# Patient Record
Sex: Female | Born: 1955 | Race: White | Hispanic: No | Marital: Single | Smoking: Current every day smoker
Health system: Southern US, Community
[De-identification: ages and names within clinical notes are randomized; demographics above are authoritative.]

## PROBLEM LIST (undated history)

## (undated) DIAGNOSIS — C801 Malignant (primary) neoplasm, unspecified: Secondary | ICD-10-CM

## (undated) DIAGNOSIS — J449 Chronic obstructive pulmonary disease, unspecified: Secondary | ICD-10-CM

## (undated) DIAGNOSIS — I10 Essential (primary) hypertension: Secondary | ICD-10-CM

## (undated) DIAGNOSIS — J45909 Unspecified asthma, uncomplicated: Secondary | ICD-10-CM

## (undated) DIAGNOSIS — E119 Type 2 diabetes mellitus without complications: Secondary | ICD-10-CM

## (undated) DIAGNOSIS — K589 Irritable bowel syndrome without diarrhea: Secondary | ICD-10-CM

## (undated) HISTORY — PX: ABDOMINAL HYSTERECTOMY: SHX81

---

## 1996-07-19 HISTORY — PX: CHOLECYSTECTOMY: SHX55

## 2018-01-19 ENCOUNTER — Encounter (HOSPITAL_BASED_OUTPATIENT_CLINIC_OR_DEPARTMENT_OTHER): Payer: Self-pay | Admitting: *Deleted

## 2018-01-19 ENCOUNTER — Other Ambulatory Visit: Payer: Self-pay

## 2018-01-19 ENCOUNTER — Emergency Department (HOSPITAL_COMMUNITY): Payer: BLUE CROSS/BLUE SHIELD | Admitting: Anesthesiology

## 2018-01-19 ENCOUNTER — Encounter (HOSPITAL_COMMUNITY)
Admission: EM | Disposition: A | Discharge status: Home / Self Care | Payer: Self-pay | Source: Home / Self Care | Attending: Emergency Medicine

## 2018-01-19 ENCOUNTER — Emergency Department (HOSPITAL_BASED_OUTPATIENT_CLINIC_OR_DEPARTMENT_OTHER): Payer: BLUE CROSS/BLUE SHIELD

## 2018-01-19 ENCOUNTER — Observation Stay (HOSPITAL_BASED_OUTPATIENT_CLINIC_OR_DEPARTMENT_OTHER)
Admission: EM | Admit: 2018-01-19 | Discharge: 2018-01-20 | Disposition: A | Discharge status: Home / Self Care | Payer: BLUE CROSS/BLUE SHIELD | Attending: Surgery | Admitting: Surgery

## 2018-01-19 DIAGNOSIS — J45909 Unspecified asthma, uncomplicated: Secondary | ICD-10-CM | POA: Diagnosis not present

## 2018-01-19 DIAGNOSIS — F17210 Nicotine dependence, cigarettes, uncomplicated: Secondary | ICD-10-CM | POA: Diagnosis not present

## 2018-01-19 DIAGNOSIS — Z79899 Other long term (current) drug therapy: Secondary | ICD-10-CM | POA: Insufficient documentation

## 2018-01-19 DIAGNOSIS — J449 Chronic obstructive pulmonary disease, unspecified: Secondary | ICD-10-CM | POA: Diagnosis not present

## 2018-01-19 DIAGNOSIS — Z720 Tobacco use: Secondary | ICD-10-CM

## 2018-01-19 DIAGNOSIS — K3580 Unspecified acute appendicitis: Secondary | ICD-10-CM

## 2018-01-19 DIAGNOSIS — Z7984 Long term (current) use of oral hypoglycemic drugs: Secondary | ICD-10-CM | POA: Insufficient documentation

## 2018-01-19 DIAGNOSIS — K3533 Acute appendicitis with perforation and localized peritonitis, with abscess: Secondary | ICD-10-CM | POA: Insufficient documentation

## 2018-01-19 DIAGNOSIS — I10 Essential (primary) hypertension: Secondary | ICD-10-CM | POA: Diagnosis present

## 2018-01-19 DIAGNOSIS — E669 Obesity, unspecified: Secondary | ICD-10-CM

## 2018-01-19 DIAGNOSIS — Z9049 Acquired absence of other specified parts of digestive tract: Secondary | ICD-10-CM | POA: Insufficient documentation

## 2018-01-19 DIAGNOSIS — K3532 Acute appendicitis with perforation and localized peritonitis, without abscess: Principal | ICD-10-CM | POA: Insufficient documentation

## 2018-01-19 DIAGNOSIS — R1031 Right lower quadrant pain: Secondary | ICD-10-CM | POA: Diagnosis present

## 2018-01-19 DIAGNOSIS — K35891 Other acute appendicitis without perforation, with gangrene: Secondary | ICD-10-CM | POA: Insufficient documentation

## 2018-01-19 DIAGNOSIS — E119 Type 2 diabetes mellitus without complications: Secondary | ICD-10-CM

## 2018-01-19 DIAGNOSIS — R112 Nausea with vomiting, unspecified: Secondary | ICD-10-CM

## 2018-01-19 DIAGNOSIS — K589 Irritable bowel syndrome without diarrhea: Secondary | ICD-10-CM

## 2018-01-19 DIAGNOSIS — T40605A Adverse effect of unspecified narcotics, initial encounter: Secondary | ICD-10-CM

## 2018-01-19 HISTORY — DX: Malignant (primary) neoplasm, unspecified: C80.1

## 2018-01-19 HISTORY — DX: Type 2 diabetes mellitus without complications: E11.9

## 2018-01-19 HISTORY — DX: Unspecified asthma, uncomplicated: J45.909

## 2018-01-19 HISTORY — DX: Irritable bowel syndrome, unspecified: K58.9

## 2018-01-19 HISTORY — DX: Chronic obstructive pulmonary disease, unspecified: J44.9

## 2018-01-19 HISTORY — PX: LAPAROSCOPIC APPENDECTOMY: SHX408

## 2018-01-19 HISTORY — DX: Essential (primary) hypertension: I10

## 2018-01-19 LAB — URINALYSIS, ROUTINE W REFLEX MICROSCOPIC
Bilirubin Urine: NEGATIVE
GLUCOSE, UA: NEGATIVE mg/dL
KETONES UR: NEGATIVE mg/dL
LEUKOCYTES UA: NEGATIVE
Nitrite: POSITIVE — AB
PH: 6 (ref 5.0–8.0)
Protein, ur: NEGATIVE mg/dL

## 2018-01-19 LAB — COMPREHENSIVE METABOLIC PANEL
ALBUMIN: 4.3 g/dL (ref 3.5–5.0)
ALK PHOS: 85 U/L (ref 38–126)
ALT: 23 U/L (ref 0–44)
AST: 21 U/L (ref 15–41)
Anion gap: 10 (ref 5–15)
BILIRUBIN TOTAL: 0.5 mg/dL (ref 0.3–1.2)
BUN: 13 mg/dL (ref 8–23)
CO2: 26 mmol/L (ref 22–32)
Calcium: 9.2 mg/dL (ref 8.9–10.3)
Chloride: 101 mmol/L (ref 98–111)
Creatinine, Ser: 0.8 mg/dL (ref 0.44–1.00)
GFR calc Af Amer: >60 mL/min (ref >60)
GFR calc non Af Amer: >60 mL/min (ref >60)
GLUCOSE: 121 mg/dL — AB (ref 70–99)
Potassium: 4.3 mmol/L (ref 3.5–5.1)
Sodium: 137 mmol/L (ref 135–145)
TOTAL PROTEIN: 7.9 g/dL (ref 6.5–8.1)

## 2018-01-19 LAB — CBC WITH DIFFERENTIAL/PLATELET
BASOS ABS: 0 10*3/uL (ref 0.0–0.1)
BASOS PCT: 0 %
Eosinophils Absolute: 0.2 10*3/uL (ref 0.0–0.7)
Eosinophils Relative: 1 %
HEMATOCRIT: 54.1 % — AB (ref 36.0–46.0)
HEMOGLOBIN: 18.4 g/dL — AB (ref 12.0–15.0)
Lymphocytes Relative: 11 %
Lymphs Abs: 1.6 10*3/uL (ref 0.7–4.0)
MCH: 29.3 pg (ref 26.0–34.0)
MCHC: 34 g/dL (ref 30.0–36.0)
MCV: 86.1 fL (ref 78.0–100.0)
Monocytes Absolute: 0.9 10*3/uL (ref 0.1–1.0)
Monocytes Relative: 6 %
NEUTROS ABS: 12 10*3/uL — AB (ref 1.7–7.7)
NEUTROS PCT: 82 %
Platelets: 190 10*3/uL (ref 150–400)
RBC: 6.28 MIL/uL — ABNORMAL HIGH (ref 3.87–5.11)
RDW: 15.5 % (ref 11.5–15.5)
WBC: 14.8 10*3/uL — ABNORMAL HIGH (ref 4.0–10.5)

## 2018-01-19 LAB — GLUCOSE, CAPILLARY
Glucose-Capillary: 139 mg/dL — ABNORMAL HIGH (ref 70–99)
Glucose-Capillary: 161 mg/dL — ABNORMAL HIGH (ref 70–99)
Glucose-Capillary: 124 mg/dL — ABNORMAL HIGH (ref 70–99)

## 2018-01-19 LAB — LIPASE, BLOOD: Lipase: 26 U/L (ref 11–51)

## 2018-01-19 LAB — URINALYSIS, MICROSCOPIC (REFLEX)

## 2018-01-19 SURGERY — APPENDECTOMY, LAPAROSCOPIC
Anesthesia: General

## 2018-01-19 MED ORDER — SUGAMMADEX SODIUM 200 MG/2ML IV SOLN
INTRAVENOUS | Status: AC
  Filled 2018-01-19: qty 2

## 2018-01-19 MED ORDER — GABAPENTIN 300 MG PO CAPS
ORAL_CAPSULE | ORAL | Status: AC
  Administered 2018-01-19: 300 mg via ORAL
  Filled 2018-01-19: qty 1

## 2018-01-19 MED ORDER — METHOCARBAMOL 500 MG PO TABS: 1000.0000 mg | ORAL_TABLET | Freq: Four times a day (QID) | ORAL | Status: DC | PRN

## 2018-01-19 MED ORDER — MIDAZOLAM HCL 2 MG/2ML IJ SOLN
INTRAMUSCULAR | Status: DC | PRN
  Administered 2018-01-19: 2 mg via INTRAVENOUS

## 2018-01-19 MED ORDER — SODIUM CHLORIDE 0.9 % IV SOLN
INTRAVENOUS | Status: AC
  Filled 2018-01-19: qty 2

## 2018-01-19 MED ORDER — HYDROMORPHONE HCL 1 MG/ML IJ SOLN: 0.2500 mg | INTRAMUSCULAR | Status: DC | PRN

## 2018-01-19 MED ORDER — DEXAMETHASONE SODIUM PHOSPHATE 10 MG/ML IJ SOLN
INTRAMUSCULAR | Status: AC
Start: 2018-01-19 — End: ?
  Filled 2018-01-19: qty 1

## 2018-01-19 MED ORDER — INSULIN ASPART 100 UNIT/ML ~~LOC~~ SOLN: 0.0000 [IU] | Freq: Every day | SUBCUTANEOUS | Status: DC

## 2018-01-19 MED ORDER — TIOTROPIUM BROMIDE MONOHYDRATE 18 MCG IN CAPS
18.0000 ug | ORAL_CAPSULE | Freq: Every day | RESPIRATORY_TRACT | Status: DC
  Administered 2018-01-20: 18 ug via RESPIRATORY_TRACT
  Filled 2018-01-19: qty 5

## 2018-01-19 MED ORDER — GABAPENTIN 300 MG PO CAPS
300.0000 mg | ORAL_CAPSULE | ORAL | Status: AC
  Administered 2018-01-19: 300 mg via ORAL

## 2018-01-19 MED ORDER — ALUM & MAG HYDROXIDE-SIMETH 200-200-20 MG/5ML PO SUSP: 30.0000 mL | Freq: Four times a day (QID) | ORAL | Status: DC | PRN

## 2018-01-19 MED ORDER — PROCHLORPERAZINE EDISYLATE 10 MG/2ML IJ SOLN: 5.0000 mg | INTRAMUSCULAR | Status: DC | PRN

## 2018-01-19 MED ORDER — ENOXAPARIN SODIUM 40 MG/0.4ML ~~LOC~~ SOLN
40.0000 mg | SUBCUTANEOUS | Status: DC
  Filled 2018-01-19: qty 0.4

## 2018-01-19 MED ORDER — ALBUTEROL SULFATE (2.5 MG/3ML) 0.083% IN NEBU
2.5000 mg | INHALATION_SOLUTION | Freq: Once | RESPIRATORY_TRACT | Status: AC
  Administered 2018-01-19: 2.5 mg via RESPIRATORY_TRACT

## 2018-01-19 MED ORDER — SODIUM CHLORIDE 0.9 % IV SOLN
2.0000 g | INTRAVENOUS | Status: AC
  Administered 2018-01-19: 2 g via INTRAVENOUS
  Filled 2018-01-19: qty 2

## 2018-01-19 MED ORDER — GLIPIZIDE 5 MG PO TABS
5.0000 mg | ORAL_TABLET | Freq: Every day | ORAL | Status: DC
  Administered 2018-01-20: 5 mg via ORAL
  Filled 2018-01-19: qty 1

## 2018-01-19 MED ORDER — PIPERACILLIN-TAZOBACTAM 3.375 G IVPB
3.3750 g | Freq: Three times a day (TID) | INTRAVENOUS | Status: DC
  Administered 2018-01-19 – 2018-01-20 (×3): 3.375 g via INTRAVENOUS
  Filled 2018-01-19 (×4): qty 50

## 2018-01-19 MED ORDER — PHENYLEPHRINE 40 MCG/ML (10ML) SYRINGE FOR IV PUSH (FOR BLOOD PRESSURE SUPPORT)
PREFILLED_SYRINGE | INTRAVENOUS | Status: AC
  Filled 2018-01-19: qty 10

## 2018-01-19 MED ORDER — FLUCONAZOLE 200 MG PO TABS
200.0000 mg | ORAL_TABLET | Freq: Every day | ORAL | Status: DC
  Administered 2018-01-19 – 2018-01-20 (×2): 200 mg via ORAL
  Filled 2018-01-19: qty 1
  Filled 2018-01-19: qty 2
  Filled 2018-01-19: qty 1

## 2018-01-19 MED ORDER — ALBUTEROL SULFATE (2.5 MG/3ML) 0.083% IN NEBU
INHALATION_SOLUTION | RESPIRATORY_TRACT | Status: AC
  Administered 2018-01-19: 2.5 mg via RESPIRATORY_TRACT
  Filled 2018-01-19: qty 3

## 2018-01-19 MED ORDER — STERILE WATER FOR IRRIGATION IR SOLN
Status: DC | PRN
  Administered 2018-01-19: 2000 mL

## 2018-01-19 MED ORDER — SIMETHICONE 80 MG PO CHEW
40.0000 mg | CHEWABLE_TABLET | Freq: Four times a day (QID) | ORAL | Status: DC | PRN
  Filled 2018-01-19: qty 1

## 2018-01-19 MED ORDER — TRAMADOL HCL 50 MG PO TABS: 50.0000 mg | ORAL_TABLET | Freq: Four times a day (QID) | ORAL | Status: DC | PRN

## 2018-01-19 MED ORDER — HYDROCORTISONE 1 % EX CREA
1.0000 "application " | TOPICAL_CREAM | Freq: Three times a day (TID) | CUTANEOUS | Status: DC | PRN
  Filled 2018-01-19: qty 28

## 2018-01-19 MED ORDER — BISACODYL 10 MG RE SUPP: 10.0000 mg | Freq: Two times a day (BID) | RECTAL | Status: DC | PRN

## 2018-01-19 MED ORDER — MEPERIDINE HCL 50 MG/ML IJ SOLN: 6.2500 mg | INTRAMUSCULAR | Status: DC | PRN

## 2018-01-19 MED ORDER — POLYETHYLENE GLYCOL 3350 17 G PO PACK: 17.0000 g | PACK | Freq: Two times a day (BID) | ORAL | Status: DC | PRN

## 2018-01-19 MED ORDER — SCOPOLAMINE 1 MG/3DAYS TD PT72
MEDICATED_PATCH | TRANSDERMAL | Status: AC
  Administered 2018-01-19: 1.5 mg via TRANSDERMAL
  Filled 2018-01-19: qty 1

## 2018-01-19 MED ORDER — ONDANSETRON HCL 4 MG/2ML IJ SOLN: 4.0000 mg | Freq: Four times a day (QID) | INTRAMUSCULAR | Status: DC | PRN

## 2018-01-19 MED ORDER — METRONIDAZOLE IN NACL 5-0.79 MG/ML-% IV SOLN
500.0000 mg | Freq: Once | INTRAVENOUS | Status: AC
  Administered 2018-01-19: 500 mg via INTRAVENOUS
  Filled 2018-01-19: qty 100

## 2018-01-19 MED ORDER — ACETAMINOPHEN 500 MG PO TABS
ORAL_TABLET | ORAL | Status: AC
  Administered 2018-01-19: 1000 mg via ORAL
  Filled 2018-01-19: qty 2

## 2018-01-19 MED ORDER — LIP MEDEX EX OINT
1.0000 "application " | TOPICAL_OINTMENT | Freq: Two times a day (BID) | CUTANEOUS | Status: DC
  Administered 2018-01-19 – 2018-01-20 (×2): 1 via TOPICAL
  Filled 2018-01-19: qty 7

## 2018-01-19 MED ORDER — IOPAMIDOL (ISOVUE-300) INJECTION 61%
100.0000 mL | Freq: Once | INTRAVENOUS | Status: AC | PRN
  Administered 2018-01-19: 100 mL via INTRAVENOUS

## 2018-01-19 MED ORDER — CEFTRIAXONE SODIUM 2 G IJ SOLR: 2.0000 g | INTRAMUSCULAR | Status: DC

## 2018-01-19 MED ORDER — METRONIDAZOLE IN NACL 5-0.79 MG/ML-% IV SOLN: 500.0000 mg | Freq: Three times a day (TID) | INTRAVENOUS | Status: DC

## 2018-01-19 MED ORDER — BUPIVACAINE-EPINEPHRINE (PF) 0.25% -1:200000 IJ SOLN
INTRAMUSCULAR | Status: AC
  Filled 2018-01-19: qty 30

## 2018-01-19 MED ORDER — SODIUM CHLORIDE 0.9 % IV SOLN
INTRAVENOUS | Status: DC
  Administered 2018-01-19 (×2): via INTRAVENOUS

## 2018-01-19 MED ORDER — METHOCARBAMOL 1000 MG/10ML IJ SOLN
1000.0000 mg | Freq: Four times a day (QID) | INTRAMUSCULAR | Status: DC | PRN
  Filled 2018-01-19: qty 10

## 2018-01-19 MED ORDER — LIDOCAINE 2% (20 MG/ML) 5 ML SYRINGE
INTRAMUSCULAR | Status: AC
  Filled 2018-01-19: qty 5

## 2018-01-19 MED ORDER — LIDOCAINE 2% (20 MG/ML) 5 ML SYRINGE
INTRAMUSCULAR | Status: DC | PRN
  Administered 2018-01-19: 100 mg via INTRAVENOUS

## 2018-01-19 MED ORDER — SUCCINYLCHOLINE CHLORIDE 200 MG/10ML IV SOSY
PREFILLED_SYRINGE | INTRAVENOUS | Status: AC
  Filled 2018-01-19: qty 10

## 2018-01-19 MED ORDER — ACETAMINOPHEN 500 MG PO TABS
1000.0000 mg | ORAL_TABLET | ORAL | Status: AC
  Administered 2018-01-19: 1000 mg via ORAL

## 2018-01-19 MED ORDER — CITALOPRAM HYDROBROMIDE 20 MG PO TABS
10.0000 mg | ORAL_TABLET | Freq: Every day | ORAL | Status: DC
  Administered 2018-01-20: 10 mg via ORAL
  Filled 2018-01-19: qty 1

## 2018-01-19 MED ORDER — FENTANYL CITRATE (PF) 100 MCG/2ML IJ SOLN: 25.0000 ug | INTRAMUSCULAR | Status: DC | PRN

## 2018-01-19 MED ORDER — IOPAMIDOL (ISOVUE-300) INJECTION 61%
30.0000 mL | Freq: Once | INTRAVENOUS | Status: AC | PRN
  Administered 2018-01-19: 15 mL via ORAL

## 2018-01-19 MED ORDER — SODIUM CHLORIDE 0.9 % IV BOLUS
1000.0000 mL | Freq: Once | INTRAVENOUS | Status: AC
  Administered 2018-01-19: 1000 mL via INTRAVENOUS

## 2018-01-19 MED ORDER — GUAIFENESIN-DM 100-10 MG/5ML PO SYRP: 10.0000 mL | ORAL_SOLUTION | ORAL | Status: DC | PRN

## 2018-01-19 MED ORDER — SCOPOLAMINE 1 MG/3DAYS TD PT72
1.0000 | MEDICATED_PATCH | TRANSDERMAL | Status: DC
  Administered 2018-01-19: 1.5 mg via TRANSDERMAL
  Filled 2018-01-19: qty 1

## 2018-01-19 MED ORDER — LACTATED RINGERS IR SOLN
Status: DC | PRN
  Administered 2018-01-19 (×3): 1000 mL

## 2018-01-19 MED ORDER — ROCURONIUM BROMIDE 100 MG/10ML IV SOLN
INTRAVENOUS | Status: DC | PRN
  Administered 2018-01-19: 50 mg via INTRAVENOUS
  Administered 2018-01-19: 10 mg via INTRAVENOUS

## 2018-01-19 MED ORDER — ALBUTEROL SULFATE (2.5 MG/3ML) 0.083% IN NEBU: 2.5000 mg | INHALATION_SOLUTION | Freq: Four times a day (QID) | RESPIRATORY_TRACT | Status: DC | PRN

## 2018-01-19 MED ORDER — KETAMINE HCL 10 MG/ML IJ SOLN
INTRAMUSCULAR | Status: DC | PRN
  Administered 2018-01-19: 20 mg via INTRAVENOUS

## 2018-01-19 MED ORDER — PHENYLEPHRINE 40 MCG/ML (10ML) SYRINGE FOR IV PUSH (FOR BLOOD PRESSURE SUPPORT)
PREFILLED_SYRINGE | INTRAVENOUS | Status: DC | PRN
  Administered 2018-01-19: 80 ug via INTRAVENOUS

## 2018-01-19 MED ORDER — CHLORHEXIDINE GLUCONATE CLOTH 2 % EX PADS: 6.0000 | MEDICATED_PAD | Freq: Once | CUTANEOUS | Status: DC

## 2018-01-19 MED ORDER — 0.9 % SODIUM CHLORIDE (POUR BTL) OPTIME
TOPICAL | Status: DC | PRN
  Administered 2018-01-19: 1000 mL

## 2018-01-19 MED ORDER — PROPOFOL 10 MG/ML IV BOLUS
INTRAVENOUS | Status: DC | PRN
  Administered 2018-01-19: 200 mg via INTRAVENOUS

## 2018-01-19 MED ORDER — ACETAMINOPHEN 500 MG PO TABS
1000.0000 mg | ORAL_TABLET | Freq: Three times a day (TID) | ORAL | Status: DC
  Administered 2018-01-19 – 2018-01-20 (×3): 1000 mg via ORAL
  Filled 2018-01-19 (×3): qty 2

## 2018-01-19 MED ORDER — SODIUM CHLORIDE 0.9 % IV SOLN
2.0000 g | Freq: Once | INTRAVENOUS | Status: AC
  Administered 2018-01-19: 2 g via INTRAVENOUS
  Filled 2018-01-19: qty 20

## 2018-01-19 MED ORDER — FENTANYL CITRATE (PF) 100 MCG/2ML IJ SOLN
INTRAMUSCULAR | Status: AC
  Filled 2018-01-19: qty 2

## 2018-01-19 MED ORDER — HYDROCORTISONE 2.5 % RE CREA
1.0000 "application " | TOPICAL_CREAM | Freq: Four times a day (QID) | RECTAL | Status: DC | PRN
  Filled 2018-01-19: qty 28.35

## 2018-01-19 MED ORDER — DIPHENHYDRAMINE HCL 50 MG/ML IJ SOLN: 12.5000 mg | Freq: Four times a day (QID) | INTRAMUSCULAR | Status: DC | PRN

## 2018-01-19 MED ORDER — PROPOFOL 10 MG/ML IV BOLUS
INTRAVENOUS | Status: AC
  Filled 2018-01-19: qty 40

## 2018-01-19 MED ORDER — ACETAMINOPHEN 325 MG PO TABS: 650.0000 mg | ORAL_TABLET | Freq: Four times a day (QID) | ORAL | Status: DC | PRN

## 2018-01-19 MED ORDER — CELECOXIB 200 MG PO CAPS
200.0000 mg | ORAL_CAPSULE | ORAL | Status: AC
  Administered 2018-01-19: 200 mg via ORAL
  Filled 2018-01-19: qty 1

## 2018-01-19 MED ORDER — MENTHOL 3 MG MT LOZG
1.0000 | LOZENGE | OROMUCOSAL | Status: DC | PRN
  Filled 2018-01-19: qty 9

## 2018-01-19 MED ORDER — PSYLLIUM 95 % PO PACK
1.0000 | PACK | Freq: Every day | ORAL | Status: DC
  Administered 2018-01-20: 1 via ORAL
  Filled 2018-01-19: qty 1

## 2018-01-19 MED ORDER — ASPIRIN EC 81 MG PO TBEC
81.0000 mg | DELAYED_RELEASE_TABLET | Freq: Every day | ORAL | Status: DC
  Administered 2018-01-20: 81 mg via ORAL
  Filled 2018-01-19: qty 1

## 2018-01-19 MED ORDER — GABAPENTIN 300 MG PO CAPS
300.0000 mg | ORAL_CAPSULE | Freq: Two times a day (BID) | ORAL | Status: DC
  Administered 2018-01-19 – 2018-01-20 (×2): 300 mg via ORAL
  Filled 2018-01-19 (×2): qty 1

## 2018-01-19 MED ORDER — LORATADINE 10 MG PO TABS
10.0000 mg | ORAL_TABLET | Freq: Every day | ORAL | Status: DC
  Administered 2018-01-20: 10 mg via ORAL
  Filled 2018-01-19: qty 1

## 2018-01-19 MED ORDER — INSULIN ASPART 100 UNIT/ML ~~LOC~~ SOLN
0.0000 [IU] | Freq: Three times a day (TID) | SUBCUTANEOUS | Status: DC
Start: 2018-01-19 — End: 2018-01-20

## 2018-01-19 MED ORDER — ROCURONIUM BROMIDE 100 MG/10ML IV SOLN
INTRAVENOUS | Status: AC
  Filled 2018-01-19: qty 1

## 2018-01-19 MED ORDER — SUGAMMADEX SODIUM 200 MG/2ML IV SOLN
INTRAVENOUS | Status: DC | PRN
  Administered 2018-01-19: 200 mg via INTRAVENOUS

## 2018-01-19 MED ORDER — PROMETHAZINE HCL 25 MG/ML IJ SOLN: 6.2500 mg | INTRAMUSCULAR | Status: DC | PRN

## 2018-01-19 MED ORDER — DIPHENHYDRAMINE HCL 12.5 MG/5ML PO ELIX: 12.5000 mg | ORAL_SOLUTION | Freq: Four times a day (QID) | ORAL | Status: DC | PRN

## 2018-01-19 MED ORDER — LACTATED RINGERS IV SOLN: INTRAVENOUS | Status: DC

## 2018-01-19 MED ORDER — LACTATED RINGERS IV BOLUS: 1000.0000 mL | Freq: Three times a day (TID) | INTRAVENOUS | Status: DC | PRN

## 2018-01-19 MED ORDER — PHENOL 1.4 % MT LIQD
1.0000 | OROMUCOSAL | Status: DC | PRN
  Filled 2018-01-19: qty 177

## 2018-01-19 MED ORDER — SODIUM CHLORIDE 0.9 % IV SOLN: 2.0000 g | INTRAVENOUS | Status: DC

## 2018-01-19 MED ORDER — ACETAMINOPHEN 650 MG RE SUPP: 650.0000 mg | Freq: Four times a day (QID) | RECTAL | Status: DC | PRN

## 2018-01-19 MED ORDER — NYSTATIN 100000 UNIT/GM EX CREA
1.0000 "application " | TOPICAL_CREAM | Freq: Two times a day (BID) | CUTANEOUS | Status: DC
  Administered 2018-01-19 – 2018-01-20 (×2): 1 via TOPICAL
  Filled 2018-01-19: qty 15

## 2018-01-19 MED ORDER — LISINOPRIL 10 MG PO TABS
10.0000 mg | ORAL_TABLET | Freq: Two times a day (BID) | ORAL | Status: DC
  Administered 2018-01-19: 10 mg via ORAL
  Filled 2018-01-19 (×2): qty 1

## 2018-01-19 MED ORDER — IBUPROFEN 200 MG PO TABS: 400.0000 mg | ORAL_TABLET | Freq: Four times a day (QID) | ORAL | Status: DC | PRN

## 2018-01-19 MED ORDER — ACETAMINOPHEN 10 MG/ML IV SOLN: 1000.0000 mg | Freq: Once | INTRAVENOUS | Status: DC | PRN

## 2018-01-19 MED ORDER — DICYCLOMINE HCL 20 MG PO TABS
20.0000 mg | ORAL_TABLET | Freq: Every day | ORAL | Status: DC
  Administered 2018-01-20: 20 mg via ORAL
  Filled 2018-01-19: qty 1

## 2018-01-19 MED ORDER — ONDANSETRON HCL 4 MG/2ML IJ SOLN
INTRAMUSCULAR | Status: DC | PRN
  Administered 2018-01-19: 4 mg via INTRAVENOUS

## 2018-01-19 MED ORDER — HYDROCHLOROTHIAZIDE 25 MG PO TABS: 12.5000 mg | ORAL_TABLET | ORAL | Status: DC

## 2018-01-19 MED ORDER — DEXAMETHASONE SODIUM PHOSPHATE 10 MG/ML IJ SOLN
INTRAMUSCULAR | Status: DC | PRN
  Administered 2018-01-19: 10 mg via INTRAVENOUS

## 2018-01-19 MED ORDER — BUPIVACAINE LIPOSOME 1.3 % IJ SUSP
20.0000 mL | Freq: Once | INTRAMUSCULAR | Status: DC
  Filled 2018-01-19: qty 20

## 2018-01-19 MED ORDER — MIDAZOLAM HCL 2 MG/2ML IJ SOLN
INTRAMUSCULAR | Status: AC
  Filled 2018-01-19: qty 2

## 2018-01-19 MED ORDER — METOCLOPRAMIDE HCL 5 MG/ML IJ SOLN: 10.0000 mg | Freq: Four times a day (QID) | INTRAMUSCULAR | Status: DC | PRN

## 2018-01-19 MED ORDER — HYDROCODONE-ACETAMINOPHEN 7.5-325 MG PO TABS: 1.0000 | ORAL_TABLET | Freq: Once | ORAL | Status: DC | PRN

## 2018-01-19 MED ORDER — ONDANSETRON 4 MG PO TBDP: 4.0000 mg | ORAL_TABLET | Freq: Four times a day (QID) | ORAL | Status: DC | PRN

## 2018-01-19 MED ORDER — ONDANSETRON HCL 4 MG/2ML IJ SOLN
INTRAMUSCULAR | Status: AC
  Filled 2018-01-19: qty 2

## 2018-01-19 MED ORDER — MAGIC MOUTHWASH
15.0000 mL | Freq: Four times a day (QID) | ORAL | Status: DC | PRN
  Filled 2018-01-19: qty 15

## 2018-01-19 MED ORDER — BUPIVACAINE-EPINEPHRINE 0.25% -1:200000 IJ SOLN
INTRAMUSCULAR | Status: DC | PRN
  Administered 2018-01-19: 30 mL

## 2018-01-19 MED ORDER — PSEUDOEPHEDRINE HCL 60 MG PO TABS
60.0000 mg | ORAL_TABLET | ORAL | Status: DC | PRN
Start: 2018-01-19 — End: 2018-01-20

## 2018-01-19 MED ORDER — FENTANYL CITRATE (PF) 100 MCG/2ML IJ SOLN
INTRAMUSCULAR | Status: DC | PRN
  Administered 2018-01-19: 100 ug via INTRAVENOUS

## 2018-01-19 SURGICAL SUPPLY — 51 items
APPLIER CLIP 5 13 M/L LIGAMAX5 (MISCELLANEOUS)
APPLIER CLIP ROT 10 11.4 M/L (STAPLE)
CABLE HIGH FREQUENCY MONO STRZ (ELECTRODE) ×2 IMPLANT
CHLORAPREP W/TINT 26ML (MISCELLANEOUS) ×2 IMPLANT
CLIP APPLIE 5 13 M/L LIGAMAX5 (MISCELLANEOUS) IMPLANT
CLIP APPLIE ROT 10 11.4 M/L (STAPLE) IMPLANT
COVER SURGICAL LIGHT HANDLE (MISCELLANEOUS) ×2 IMPLANT
CUTTER FLEX LINEAR 45M (STAPLE) IMPLANT
DECANTER SPIKE VIAL GLASS SM (MISCELLANEOUS) ×2 IMPLANT
DEVICE TROCAR PUNCTURE CLOSURE (ENDOMECHANICALS) IMPLANT
DRAPE LAPAROSCOPIC ABDOMINAL (DRAPES) ×2 IMPLANT
DRAPE WARM FLUID 44X44 (DRAPE) ×2 IMPLANT
DRSG TEGADERM 2-3/8X2-3/4 SM (GAUZE/BANDAGES/DRESSINGS) ×2 IMPLANT
DRSG TEGADERM 4X4.75 (GAUZE/BANDAGES/DRESSINGS) ×2 IMPLANT
ELECT REM PT RETURN 15FT ADLT (MISCELLANEOUS) ×2 IMPLANT
ENDOLOOP SUT PDS II  0 18 (SUTURE) ×1
ENDOLOOP SUT PDS II 0 18 (SUTURE) ×1 IMPLANT
GAUZE SPONGE 2X2 8PLY STRL LF (GAUZE/BANDAGES/DRESSINGS) ×1 IMPLANT
GLOVE BIOGEL M 7.0 STRL (GLOVE) ×6 IMPLANT
GLOVE BIOGEL PI IND STRL 7.5 (GLOVE) ×1 IMPLANT
GLOVE BIOGEL PI INDICATOR 7.5 (GLOVE) ×1
GLOVE ECLIPSE 8.0 STRL XLNG CF (GLOVE) ×2 IMPLANT
GLOVE INDICATOR 8.0 STRL GRN (GLOVE) ×2 IMPLANT
GLOVE SURG SS PI 7.5 STRL IVOR (GLOVE) ×2 IMPLANT
GOWN STRL REUS W/TWL XL LVL3 (GOWN DISPOSABLE) ×6 IMPLANT
IRRIG SUCT STRYKERFLOW 2 WTIP (MISCELLANEOUS) ×2
IRRIGATION SUCT STRKRFLW 2 WTP (MISCELLANEOUS) ×1 IMPLANT
KIT BASIN OR (CUSTOM PROCEDURE TRAY) ×2 IMPLANT
PAD POSITIONING PINK XL (MISCELLANEOUS) ×2 IMPLANT
POUCH RETRIEVAL ECOSAC 10 (ENDOMECHANICALS) ×1 IMPLANT
POUCH RETRIEVAL ECOSAC 10MM (ENDOMECHANICALS) ×1
POUCH SPECIMEN RETRIEVAL 10MM (ENDOMECHANICALS) IMPLANT
RELOAD 45 VASCULAR/THIN (ENDOMECHANICALS) IMPLANT
RELOAD STAPLE TA45 3.5 REG BLU (ENDOMECHANICALS) IMPLANT
RELOAD STAPLER BLUE 60MM (STAPLE) ×1 IMPLANT
SCISSORS LAP 5X35 DISP (ENDOMECHANICALS) ×2 IMPLANT
SHEARS HARMONIC ACE PLUS 36CM (ENDOMECHANICALS) ×2 IMPLANT
SLEEVE XCEL OPT CAN 5 100 (ENDOMECHANICALS) ×2 IMPLANT
SPONGE GAUZE 2X2 STER 10/PKG (GAUZE/BANDAGES/DRESSINGS) ×1
STAPLER ECHELON LONG 60 440 (INSTRUMENTS) ×2 IMPLANT
STAPLER RELOAD BLUE 60MM (STAPLE) ×2
SUT MNCRL AB 4-0 PS2 18 (SUTURE) ×2 IMPLANT
SUT PDS AB 0 CT1 36 (SUTURE) IMPLANT
SUT PDS AB 1 CT1 27 (SUTURE) ×4 IMPLANT
SUT SILK 2 0 SH (SUTURE) IMPLANT
TOWEL OR 17X26 10 PK STRL BLUE (TOWEL DISPOSABLE) ×2 IMPLANT
TRAY FOLEY MTR SLVR 16FR STAT (SET/KITS/TRAYS/PACK) IMPLANT
TRAY LAPAROSCOPIC (CUSTOM PROCEDURE TRAY) ×2 IMPLANT
TROCAR BLADELESS OPT 5 100 (ENDOMECHANICALS) ×2 IMPLANT
TROCAR XCEL 12X100 BLDLESS (ENDOMECHANICALS) ×2 IMPLANT
TUBING INSUF HEATED (TUBING) ×2 IMPLANT

## 2018-01-19 NOTE — Transfer of Care (Signed)
Immediate Anesthesia Transfer of Care Note  Patient: Aralynn Brake  Procedure(s) Performed: LAPAROSCOPIC APPENDECTOMY (N/A )  Patient Location: PACU  Anesthesia Type:General  Level of Consciousness: awake and patient cooperative  Airway & Oxygen Therapy: Patient Spontanous Breathing and Patient connected to face mask oxygen  Post-op Assessment: Report given to RN and Post -op Vital signs reviewed and stable  Post vital signs: Reviewed and stable  Last Vitals:  Vitals Value Taken Time  BP    Temp    Pulse 110 01/19/2018  5:20 PM  Resp 23 01/19/2018  5:20 PM  SpO2 97 % 01/19/2018  5:20 PM  Vitals shown include unvalidated device data.  Last Pain:  Vitals:   01/19/18 1357  TempSrc:   PainSc: 8          Complications: No apparent anesthesia complications

## 2018-01-19 NOTE — H&P (Addendum)
Jenny Schneider  Mar 30, 1956 [id number]  CARE TEAM:  PCP: Patient, No Pcp Per  Outpatient Care Team: Patient Care Team: Patient, No Pcp Per as PCP - General (General Practice)  Inpatient Treatment Team: Treatment Team: Attending Provider: Blanchie Dessert, MD; Registered Nurse: Jenny Haver, RN; Consulting Physician: Jenny Pace, Md, MD   This patient is a 62 y.o.female who presents today for surgical evaluation at the request of Dr Jenny Schneider, [facility] ED.   Chief complaint / Reason for evaluation: Abdominal pain.  Probable appendicitis.  Pleasant obese diabetic woman controlled on oral hypoglycemics.  History of irregular bowels.  Presumed to be irritable bowel syndrome on Bentyl.  Patient noticed crampy abdominal pain and nausea starting 3 days ago.  Initially she thought it was an irritable bowel flare but things did not improve with Bentyl and other interventions.  Pain became more intensified.  Focused in the right lower quadrant.  Decreased appetite.  Nausea.  Had vomiting this morning.  Became more intense.  Was concerned.  Went to the emergency room at [facility].  Exam and CT scan concern for appendicitis.  Surgical consultation requested.  Patient transferred to [facility] long hospital her surgeon was available.  Her son is at her bedside.  She recently relocated from [place] to live with her daughter.  She has had cholecystectomy and hysterectomy.  Not recently.  She normally can walk several miles a day.  She does smoke.  No personal nor family history of GI/colon cancer, inflammatory bowel disease, allergy such as Celiac Sprue, dietary/dairy problems, colitis, ulcers nor gastritis.  No recent sick contacts/gastroenteritis.  No travel outside the country.  No changes in diet.  No dysphagia to solids or liquids.  No significant heartburn or reflux.  No hematochezia, hematemesis, coffee ground emesis.  No evidence of prior gastric/peptic ulceration.    Assessment  Jenny Schneider  62  y.o. female       Problem List:  Principal Problem:   Acute appendicitis Active Problems:   IBS (irritable bowel syndrome)   Hypertension   Diabetes mellitus without complication (HCC)   COPD (chronic obstructive pulmonary disease) ([place])   History physical and CT scan strongly suspicious for appendicitis.  Going on for over 48 hours.  Plan:  Admit.  IV antibiotics.  IV fluids.  Nausea and pain control.  This is challenged by her poor tolerance to any narcotics.  Diabetic control.  Hypertension controlled.  Follow-up with PRN medicines for now.  Irritable bowel syndrome.  Doubt there is any relation to this at this time.  Hold Bentyl for now.  Operative exploration and probable appendectomy.  The anatomy & physiology of the digestive tract was discussed.  The pathophysiology of appendicitis and other appendiceal disorders were discussed.  Natural history risks without surgery was discussed.   I feel the risks of no intervention will lead to serious problems that outweigh the operative risks; therefore, I recommended diagnostic laparoscopy with removal of appendix to remove the pathology.  Laparoscopic & open techniques were discussed.   I noted a good likelihood this will help address the problem.   Risks such as bleeding, infection, abscess, leak, reoperation, injury to other organs, need for repair of tissues / organs, possible ostomy, hernia, heart attack, stroke, death, and other risks were discussed.  Goals of post-operative recovery were discussed as well.  We will work to minimize complications.  Questions were answered.  The patient expresses understanding & wishes to proceed with surgery.   -VTE  prophylaxis- SCDs, etc -mobilize as tolerated to help recovery  40 minutes spent in review, evaluation, examination, counseling, and coordination of care.  More than 50% of that time was spent in counseling.  Jenny Hector, MD, FACS, MASCRS Gastrointestinal and Minimally  Invasive Surgery    [address], [place], [place] [address] [phone] Main / Paging [phone] Fax   01/19/2018      Past Medical History:  Diagnosis Date  . Asthma   . Cancer ([place])    skin  . COPD (chronic obstructive pulmonary disease) ([place])   . Diabetes mellitus without complication ([place])   . Hypertension   . IBS (irritable bowel syndrome)     Past Surgical History:  Procedure Laterality Date  . ABDOMINAL HYSTERECTOMY    . CHOLECYSTECTOMY  1998    Social History   Socioeconomic History  . Marital status: Single    Spouse name: Not on file  . Number of children: Not on file  . Years of education: Not on file  . Highest education level: Not on file  Occupational History  . Not on file  Social Needs  . Financial resource strain: Not on file  . Food insecurity:    Worry: Not on file    Inability: Not on file  . Transportation needs:    Medical: Not on file    Non-medical: Not on file  Tobacco Use  . Smoking status: Current Every Day Smoker    Packs/day: 1.00    Types: Cigarettes  . Smokeless tobacco: Never Used  Substance and Sexual Activity  . Alcohol use: Not on file  . Drug use: Not on file  . Sexual activity: Not on file  Lifestyle  . Physical activity:    Days per week: Not on file    Minutes per session: Not on file  . Stress: Not on file  Relationships  . Social connections:    Talks on phone: Not on file    Gets together: Not on file    Attends religious service: Not on file    Active member of club or organization: Not on file    Attends meetings of clubs or organizations: Not on file    Relationship status: Not on file  . Intimate partner violence:    Fear of current or ex partner: Not on file    Emotionally abused: Not on file    Physically abused: Not on file    Forced sexual activity: Not on file  Other Topics Concern  . Not on file  Social History Narrative  . Not on file    History reviewed. No  pertinent family history.  Current Facility-Administered Medications  Medication Dose Route Frequency Provider Last Rate Last Dose  . 0.9 %  sodium chloride infusion   Intravenous Continuous Jenny Dessert, MD   Stopped at 01/19/18 0900  . cefTRIAXone (ROCEPHIN) 2 g in sodium chloride 0.9 % 100 mL IVPB  2 g Intravenous Once Jenny Dessert, MD       And  . metroNIDAZOLE (FLAGYL) IVPB 500 mg  500 mg Intravenous Once Plunkett, Whitney, MD      . sodium chloride 0.9 % bolus 1,000 mL  1,000 mL Intravenous Once Jenny Dessert, MD 984 mL/hr at 01/19/18 0900 1,000 mL at 01/19/18 0900   Current Outpatient Medications  Medication Sig Dispense Refill  . albuterol (PROVENTIL HFA;VENTOLIN HFA) 108 (90 Base) MCG/ACT inhaler Inhale into the lungs every 6 (six) hours as needed  for wheezing or shortness of breath.    . dicyclomine (BENTYL) 20 MG tablet Take 20 mg by mouth once.    Marland Kitchen glipiZIDE (GLUCOTROL) 5 MG tablet Take by mouth daily before breakfast.    . lisinopril (PRINIVIL,ZESTRIL) 10 MG tablet Take 10 mg by mouth 2 (two) times daily.       Allergies  Allergen Reactions  . Beta Adrenergic Blockers     ROS:   All other systems reviewed & are negative except per HPI or as noted below: Constitutional:  No fevers, chills, sweats.  Weight stable Eyes:  No vision changes, No discharge HENT:  No sore throats, nasal drainage Lymph: No neck swelling, No bruising easily Pulmonary:  No cough, productive sputum CV: No orthopnea, PND  Patient walks 30 minutes for about 2 miles without difficulty.  No exertional chest/neck/shoulder/arm pain. GI: No personal nor family history of GI/colon cancer, inflammatory bowel disease, allergy such as Celiac Sprue, dietary/dairy problems, colitis, ulcers nor gastritis.  No recent sick contacts/gastroenteritis.  No travel outside the country.  No changes in diet. Renal: No UTIs, No hematuria Genital:  No drainage, bleeding, masses Musculoskeletal: No severe joint  pain.  Good ROM major joints Skin:  No sores or lesions.  No rashes Heme/Lymph:  No easy bleeding.  No swollen lymph nodes Neuro: No focal weakness/numbness.  No seizures Psych: No suicidal ideation.  No hallucinations  BP (!) 162/82 (BP Location: Right Arm)   Pulse 98   Temp 98.4 F (36.9 C) (Oral)   Resp 20   Ht 5' 6"  (1.676 m)   Wt 98.9 kg (218 lb)   SpO2 94%   BMI 35.19 kg/m   Physical Exam: General: Pt awake/alert/oriented x4 in moderate major acute distress.  Hunched up in fetal position, obviously miserable Eyes: PERRL, normal EOM. Sclera nonicteric Neuro: CN II-XII intact w/o focal sensory/motor deficits. Lymph: No head/neck/groin lymphadenopathy Psych:  No delerium/psychosis/paranoia HENT: Normocephalic, Mucus membranes moist.  No thrush Neck: Supple, No tracheal deviation Chest: No pain.  Good respiratory excursion. CV:  Pulses intact.  Regular rhythm Abdomen: Soft, Nondistended.  Sharp tenderness to palpation right lower quadrant over McBurney's point with some guarding.  Reproduction of pain with cough in bed shake.  Other 3 quadrants are nontender.   Umbilical low midline incisions consistent with prior abdominal surgery.  No incarcerated  hernias. Gen:  No inguinal hernias.  No inguinal lymphadenopathy.   Ext:  SCDs BLE.  No significant edema.  No cyanosis Skin: No petechiae / purpurea.  No major sores Musculoskeletal: No severe joint pain.  Good ROM major joints   Results:   Labs: Results for orders placed or performed during the hospital encounter of 01/19/18 (from the past 48 hour(s))  CBC with Differential/Platelet     Status: Abnormal   Collection Time: 01/19/18  8:22 AM  Result Value Ref Range   WBC 14.8 (H) 4.0 - 10.5 K/uL   RBC 6.28 (H) 3.87 - 5.11 MIL/uL   Hemoglobin 18.4 (H) 12.0 - 15.0 g/dL   HCT 54.1 (H) 36.0 - 46.0 %   MCV 86.1 78.0 - 100.0 fL   MCH 29.3 26.0 - 34.0 pg   MCHC 34.0 30.0 - 36.0 g/dL   RDW 15.5 11.5 - 15.5 %   Platelets 190 150  - 400 K/uL   Neutrophils Relative % 82 %   Neutro Abs 12.0 (H) 1.7 - 7.7 K/uL   Lymphocytes Relative 11 %   Lymphs Abs 1.6 0.7 - 4.0  K/uL   Monocytes Relative 6 %   Monocytes Absolute 0.9 0.1 - 1.0 K/uL   Eosinophils Relative 1 %   Eosinophils Absolute 0.2 0.0 - 0.7 K/uL   Basophils Relative 0 %   Basophils Absolute 0.0 0.0 - 0.1 K/uL    Comment: Performed at [facility], [address] [place], [place] [address]  Comprehensive metabolic panel     Status: Abnormal   Collection Time: 01/19/18  8:22 AM  Result Value Ref Range   Sodium 137 135 - 145 mmol/L   Potassium 4.3 3.5 - 5.1 mmol/L   Chloride 101 98 - 111 mmol/L    Comment: Please note change in reference range.   CO2 26 22 - 32 mmol/L   Glucose, Bld 121 (H) 70 - 99 mg/dL    Comment: Please note change in reference range.   BUN 13 8 - 23 mg/dL    Comment: Please note change in reference range.   Creatinine, Ser 0.80 0.44 - 1.00 mg/dL   Calcium 9.2 8.9 - 10.3 mg/dL   Total Protein 7.9 6.5 - 8.1 g/dL   Albumin 4.3 3.5 - 5.0 g/dL   AST 21 15 - 41 U/L   ALT 23 0 - 44 U/L    Comment: Please note change in reference range.   Alkaline Phosphatase 85 38 - 126 U/L   Total Bilirubin 0.5 0.3 - 1.2 mg/dL   GFR calc non Af Amer >60 >60 mL/min   GFR calc Af Amer >60 >60 mL/min    Comment: (NOTE) The eGFR has been calculated using the CKD EPI equation. This calculation has not been validated in all clinical situations. eGFR's persistently <60 mL/min signify possible Chronic Kidney Disease.    Anion gap 10 5 - 15    Comment: Performed at [facility], [address] [place], [place] [address]  Lipase, blood     Status: None   Collection Time: 01/19/18  8:22 AM  Result Value Ref Range   Lipase 26 11 - 51 U/L    Comment: Performed at [facility], [address] [place], [place] [address]    Imaging / Studies: Ct Abdomen Pelvis W Contrast  Result Date: 01/19/2018 CLINICAL DATA:  Right lower  quadrant pain, vomiting EXAM: CT ABDOMEN AND PELVIS WITH CONTRAST TECHNIQUE: Multidetector CT imaging of the abdomen and pelvis was performed using the standard protocol following bolus administration of intravenous contrast. CONTRAST:  190m ISOVUE-300 IOPAMIDOL (ISOVUE-300) INJECTION 61% COMPARISON:  None. FINDINGS: Lower chest: No confluent opacities or effusions. Heart is normal size. Irregular noncalcified plaque noted along the posterior wall of the descending thoracic aorta. No aneurysm. Hepatobiliary: Fatty infiltration of the liver. No focal abnormality or biliary ductal dilatation. Prior cholecystectomy Pancreas: No focal abnormality or ductal dilatation. Spleen: No focal abnormality.  Normal size. Adrenals/Urinary Tract: Small cyst in the upper pole of the left kidney. No hydronephrosis. Renovascular calcifications in the renal hila bilaterally. No adrenal or urinary bladder abnormality. Stomach/Bowel: Appendix is dilated, measuring 12 mm with surrounding inflammatory change compatible with appendicitis. Sigmoid diverticulosis. Stomach and small bowel decompressed, unremarkable. Vascular/Lymphatic: Diffuse atherosclerotic calcifications throughout the aorta and branch vessels. Mild irregular noncalcified plaque along the proximal abdominal aorta. No aneurysm or adenopathy. Reproductive: Prior hysterectomy.  No adnexal masses. Other: No free fluid or free air. Musculoskeletal: No acute bony abnormality. IMPRESSION: Dilated inflamed appendix compatible with acute appendicitis. No evidence of rupture. Fatty liver. Diffuse advanced aortic  and branch vessel atherosclerosis. There is irregular noncalcified plaque within the distal descending thoracic aorta and proximal abdominal aorta. Electronically Signed   By: Rolm Baptise M.D.   On: 01/19/2018 09:34    Medications / Allergies: per chart  Antibiotics: Anti-infectives (From admission, onward)   Start     Dose/Rate Route Frequency Ordered Stop    01/19/18 1000  cefTRIAXone (ROCEPHIN) 2 g in sodium chloride 0.9 % 100 mL IVPB     2 g 200 mL/hr over 30 Minutes Intravenous  Once 01/19/18 0950     01/19/18 1000  metroNIDAZOLE (FLAGYL) IVPB 500 mg     500 mg 100 mL/hr over 60 Minutes Intravenous  Once 01/19/18 3664          Note: Portions of this report may have been transcribed using voice recognition software. Every effort was made to ensure accuracy; however, inadvertent computerized transcription errors may be present.   Any transcriptional errors that result from this process are unintentional.    Jenny Hector, MD, FACS, MASCRS Gastrointestinal and Minimally Invasive Surgery    [address], [place], [place] [address] [phone] Main / Paging [phone] Fax   01/19/2018

## 2018-01-19 NOTE — ED Notes (Signed)
ED TO INPATIENT HANDOFF REPORT  Name/Age/Gender Jenny Schneider 62 y.o. female  Code Status    Code Status Orders  (From admission, onward)        Start     Ordered   01/19/18 1259  Full code  Continuous     01/19/18 1302    Code Status History    This patient has a current code status but no historical code status.      Home/SNF/Other Home  Chief Complaint LOWER RIGHT ABD PAIN  Level of Care/Admitting Diagnosis ED Disposition    ED Disposition Condition Comment   Admit  Hospital Area: [facility] [[id number]]  Level of Care: Med-Surg [16]  Diagnosis: Acute appendicitis [397673]  Admitting Physician: CCS, Jardine  Attending Physician: CCS, MD [[id number]]  Bed request comments: Surgery floor  PT Class (Do Not Modify): Observation [104]  PT Acc Code (Do Not Modify): Observation [10022]       Medical History Past Medical History:  Diagnosis Date  . Asthma   . Cancer ([place])    skin  . COPD (chronic obstructive pulmonary disease) ([place])   . Diabetes mellitus without complication ([place])   . Hypertension   . IBS (irritable bowel syndrome)     Allergies Allergies  Allergen Reactions  . Beta Adrenergic Blockers     Wheezing     IV Location/Drains/Wounds Patient Lines/Drains/Airways Status   Active Line/Drains/Airways    Name:   Placement date:   Placement time:   Site:   Days:   Peripheral IV 01/19/18 Right Wrist   01/19/18    0820    Wrist   less than 1          Labs/Imaging Results for orders placed or performed during the hospital encounter of 01/19/18 (from the past 48 hour(s))  CBC with Differential/Platelet     Status: Abnormal   Collection Time: 01/19/18  8:22 AM  Result Value Ref Range   WBC 14.8 (H) 4.0 - 10.5 K/uL   RBC 6.28 (H) 3.87 - 5.11 MIL/uL   Hemoglobin 18.4 (H) 12.0 - 15.0 g/dL   HCT 54.1 (H) 36.0 - 46.0 %   MCV 86.1 78.0 - 100.0 fL   MCH 29.3 26.0 - 34.0 pg   MCHC 34.0 30.0 - 36.0 g/dL   RDW 15.5 11.5 - 15.5 %    Platelets 190 150 - 400 K/uL   Neutrophils Relative % 82 %   Neutro Abs 12.0 (H) 1.7 - 7.7 K/uL   Lymphocytes Relative 11 %   Lymphs Abs 1.6 0.7 - 4.0 K/uL   Monocytes Relative 6 %   Monocytes Absolute 0.9 0.1 - 1.0 K/uL   Eosinophils Relative 1 %   Eosinophils Absolute 0.2 0.0 - 0.7 K/uL   Basophils Relative 0 %   Basophils Absolute 0.0 0.0 - 0.1 K/uL    Comment: Performed at [facility], [address] [place], [place] [address]  Comprehensive metabolic panel     Status: Abnormal   Collection Time: 01/19/18  8:22 AM  Result Value Ref Range   Sodium 137 135 - 145 mmol/L   Potassium 4.3 3.5 - 5.1 mmol/L   Chloride 101 98 - 111 mmol/L    Comment: Please note change in reference range.   CO2 26 22 - 32 mmol/L   Glucose, Bld 121 (H) 70 - 99 mg/dL    Comment: Please note change in reference range.   BUN 13 8 - 23 mg/dL  Comment: Please note change in reference range.   Creatinine, Ser 0.80 0.44 - 1.00 mg/dL   Calcium 9.2 8.9 - 10.3 mg/dL   Total Protein 7.9 6.5 - 8.1 g/dL   Albumin 4.3 3.5 - 5.0 g/dL   AST 21 15 - 41 U/L   ALT 23 0 - 44 U/L    Comment: Please note change in reference range.   Alkaline Phosphatase 85 38 - 126 U/L   Total Bilirubin 0.5 0.3 - 1.2 mg/dL   GFR calc non Af Amer >60 >60 mL/min   GFR calc Af Amer >60 >60 mL/min    Comment: (NOTE) The eGFR has been calculated using the CKD EPI equation. This calculation has not been validated in all clinical situations. eGFR's persistently <60 mL/min signify possible Chronic Kidney Disease.    Anion gap 10 5 - 15    Comment: Performed at [facility], [address] [place], [place] [address]  Lipase, blood     Status: None   Collection Time: 01/19/18  8:22 AM  Result Value Ref Range   Lipase 26 11 - 51 U/L    Comment: Performed at [facility], [address] [place], [place] [address]  Urinalysis, Routine w reflex microscopic     Status: Abnormal   Collection Time: 01/19/18   9:41 AM  Result Value Ref Range   Color, Urine YELLOW YELLOW   APPearance CLEAR CLEAR   Specific Gravity, Urine <1.005 (L) 1.005 - 1.030   pH 6.0 5.0 - 8.0   Glucose, UA NEGATIVE NEGATIVE mg/dL   Hgb urine dipstick TRACE (A) NEGATIVE   Bilirubin Urine NEGATIVE NEGATIVE   Ketones, ur NEGATIVE NEGATIVE mg/dL   Protein, ur NEGATIVE NEGATIVE mg/dL   Nitrite POSITIVE (A) NEGATIVE   Leukocytes, UA NEGATIVE NEGATIVE    Comment: Performed at [facility], [address] [place], [place] [address]  Urinalysis, Microscopic (reflex)     Status: Abnormal   Collection Time: 01/19/18  9:41 AM  Result Value Ref Range   RBC / HPF 0-5 0 - 5 RBC/hpf   WBC, UA 0-5 0 - 5 WBC/hpf   Bacteria, UA MANY (A) NONE SEEN   Squamous Epithelial / LPF 0-5 0 - 5    Comment: Performed at [facility], [address] [place], [place] [address]   Ct Abdomen Pelvis W Contrast  Result Date: 01/19/2018 CLINICAL DATA:  Right lower quadrant pain, vomiting EXAM: CT ABDOMEN AND PELVIS WITH CONTRAST TECHNIQUE: Multidetector CT imaging of the abdomen and pelvis was performed using the standard protocol following bolus administration of intravenous contrast. CONTRAST:  1100m ISOVUE-300 IOPAMIDOL (ISOVUE-300) INJECTION 61% COMPARISON:  None. FINDINGS: Lower chest: No confluent opacities or effusions. Heart is normal size. Irregular noncalcified plaque noted along the posterior wall of the descending thoracic aorta. No aneurysm. Hepatobiliary: Fatty infiltration of the liver. No focal abnormality or biliary ductal dilatation. Prior cholecystectomy Pancreas: No focal abnormality or ductal dilatation. Spleen: No focal abnormality.  Normal size. Adrenals/Urinary Tract: Small cyst in the upper pole of the left kidney. No hydronephrosis. Renovascular calcifications in the renal hila bilaterally. No adrenal or urinary bladder abnormality. Stomach/Bowel: Appendix is dilated, measuring 12 mm with surrounding inflammatory  change compatible with appendicitis. Sigmoid diverticulosis. Stomach and small bowel decompressed, unremarkable. Vascular/Lymphatic: Diffuse atherosclerotic calcifications throughout the aorta and branch vessels. Mild irregular noncalcified plaque along the proximal abdominal aorta. No aneurysm or adenopathy. Reproductive: Prior hysterectomy.  No adnexal masses. Other: No free fluid or free air. Musculoskeletal: No acute bony abnormality. IMPRESSION: Dilated inflamed appendix compatible with acute appendicitis. No evidence of rupture. Fatty liver. Diffuse advanced aortic and branch vessel atherosclerosis. There is irregular noncalcified plaque within the distal descending thoracic aorta and proximal abdominal aorta. Electronically Signed   By: Rolm Baptise M.D.   On: 01/19/2018 09:34    Pending Labs Unresulted Labs (From admission, onward)   Start     Ordered   01/26/18 0500  Creatinine, serum  (enoxaparin (LOVENOX)    CrCl >/= 30 ml/min)  Weekly,   R    Comments:  while on enoxaparin therapy    01/19/18 1302   01/19/18 1300  HIV antibody (Routine Testing)  Add-on,   R     01/19/18 1302      Vitals/Pain Today's Vitals   01/19/18 1130 01/19/18 1147 01/19/18 1200 01/19/18 1230  BP: 128/64  123/64 114/62  Pulse: (!) 101  (!) 102 (!) 103  Resp: _0 Temp:  (!) 101 F (38.3 C)    TempSrc:  Oral    SpO2: 94%  91% 92%  Weight:      Height:      PainSc:        Isolation Precautions No active isolations  Medications Medications  0.9 %  sodium chloride infusion ( Intravenous Transfusing/Transfer 01/19/18 1131)  Chlorhexidine Gluconate Cloth 2 % PADS 6 each (has no administration in time range)    And  Chlorhexidine Gluconate Cloth 2 % PADS 6 each (has no administration in time range)  acetaminophen (TYLENOL) tablet 1,000 mg (has no administration in time range)  gabapentin (NEURONTIN) capsule 300 mg (has no administration in time range)  cefoTEtan (CEFOTAN) 2 g in sodium chloride  0.9 % 100 mL IVPB (has no administration in time range)  celecoxib (CELEBREX) capsule 200 mg (has no administration in time range)  bupivacaine liposome (EXPAREL) 1.3 % injection 266 mg (has no administration in time range)  enoxaparin (LOVENOX) injection 40 mg (has no administration in time range)  acetaminophen (TYLENOL) tablet 650 mg (has no administration in time range)    Or  acetaminophen (TYLENOL) suppository 650 mg (has no administration in time range)  diphenhydrAMINE (BENADRYL) 12.5 MG/5ML elixir 12.5 mg (has no administration in time range)    Or  diphenhydrAMINE (BENADRYL) injection 12.5 mg (has no administration in time range)  ondansetron (ZOFRAN-ODT) disintegrating tablet 4 mg (has no administration in time range)    Or  ondansetron (ZOFRAN) injection 4 mg (has no administration in time range)  simethicone (MYLICON) chewable tablet 40 mg (has no administration in time range)  cefTRIAXone (ROCEPHIN) 2 g in sodium chloride 0.9 % 100 mL IVPB (has no administration in time range)    And  metroNIDAZOLE (FLAGYL) IVPB 500 mg (has no administration in time range)  lactated ringers bolus 1,000 mL (has no administration in time range)  fentaNYL (SUBLIMAZE) injection 25-50 mcg (has no administration in time range)  traMADol (ULTRAM) tablet 50-100 mg (has no administration in time range)  methocarbamol (ROBAXIN) 1,000 mg in dextrose 5 % 50 mL IVPB (has no administration in time range)  methocarbamol (ROBAXIN) tablet 1,000 mg (has no administration in time range)  ibuprofen (ADVIL,MOTRIN) tablet 400-800 mg (has no administration in time range)  gabapentin (NEURONTIN) capsule 300 mg (has no administration in time range)  metoCLOPramide (REGLAN) injection 10 mg (has no administration in time range)  prochlorperazine (COMPAZINE) injection 5-10  mg (has no administration in time range)  lip balm (CARMEX) ointment 1 application (has no administration in time range)  magic mouthwash (has no  administration in time range)  bisacodyl (DULCOLAX) suppository 10 mg (has no administration in time range)  polyethylene glycol (MIRALAX / GLYCOLAX) packet 17 g (has no administration in time range)  guaiFENesin-dextromethorphan (ROBITUSSIN DM) 100-10 MG/5ML syrup 10 mL (has no administration in time range)  hydrocortisone (ANUSOL-HC) 2.5 % rectal cream 1 application (has no administration in time range)  alum & mag hydroxide-simeth (MAALOX/MYLANTA) 200-200-20 MG/5ML suspension 30 mL (has no administration in time range)  hydrocortisone cream 1 % 1 application (has no administration in time range)  menthol-cetylpyridinium (CEPACOL) lozenge 3 mg (has no administration in time range)  phenol (CHLORASEPTIC) mouth spray 1-2 spray (has no administration in time range)  insulin aspart (novoLOG) injection 0-15 Units (has no administration in time range)  insulin aspart (novoLOG) injection 0-5 Units (has no administration in time range)  sodium chloride 0.9 % bolus 1,000 mL (0 mLs Intravenous Stopped 01/19/18 1043)  iopamidol (ISOVUE-300) 61 % injection 30 mL (15 mLs Oral Contrast Given 01/19/18 0816)  iopamidol (ISOVUE-300) 61 % injection 100 mL (100 mLs Intravenous Contrast Given 01/19/18 0913)  cefTRIAXone (ROCEPHIN) 2 g in sodium chloride 0.9 % 100 mL IVPB (0 g Intravenous Stopped 01/19/18 1115)    And  metroNIDAZOLE (FLAGYL) IVPB 500 mg (0 mg Intravenous Stopped 01/19/18 1205)    Mobility walks

## 2018-01-19 NOTE — Op Note (Signed)
PATIENT:  Jenny Schneider  62 y.o. female  Patient Care Team: Patient, No Pcp Per as PCP - General (General Practice)  PRE-OPERATIVE DIAGNOSIS:  Appendicitis  POST-OPERATIVE DIAGNOSIS:  Acute Gangrenous Appendicitis with perforation  PROCEDURE:   LAPAROSCOPIC APPENDECTOMY  SURGEON:  Adin Hector, MD  ANESTHESIA:   local and general  EBL:  Total I/O In: 1900 [I.V.:900; IV Piggyback:1000] Out: 350 [Urine:300; Blood:50]  Delay start of Pharmacological VTE agent (>24hrs) due to surgical blood loss or risk of bleeding:  no  DRAINS: none   SPECIMEN:  APPENDIX  DISPOSITION OF SPECIMEN:  PATHOLOGY  COUNTS:  YES  PLAN OF CARE: Admit for overnight observation  PATIENT DISPOSITION:  PACU - hemodynamically stable.   INDICATIONS: Patient with concerning symptoms & work up suspicious for appendicitis.  Surgery was recommended:  The anatomy & physiology of the digestive tract was discussed.  The pathophysiology of appendicitis was discussed.  Natural history risks without surgery was discussed.   I feel the risks of no intervention will lead to serious problems that outweigh the operative risks; therefore, I recommended diagnostic laparoscopy with removal of appendix to remove the pathology.  Laparoscopic & open techniques were discussed.   I noted a good likelihood this will help address the problem.    Risks such as bleeding, infection, abscess, leak, reoperation, possible ostomy, hernia, heart attack, death, and other risks were discussed.  Goals of post-operative recovery were discussed as well.  We will work to minimize complications.  Questions were answered.  The patient expresses understanding & wishes to proceed with surgery.  OR FINDINGS: Retrocecal appendix with fecalith with mid perforation and chronic phlegmon and localized peritonitis.  Consistent with perforation and early gangrene.  DESCRIPTION:   Informed consent was confirmed.  The patient underwent general anaesthesia  without difficulty.  The patient was positioned appropriately.  VTE prevention in place.  The patient's abdomen was clipped, prepped, & draped in a sterile fashion.  Surgical timeout confirmed our plan.  Peritoneal entry with a laparoscopic port was obtained using optical entry technique in the left upper abdomen as the patient was positioned in reverse Trendelenburg.  Entry was clean.  I induced carbon dioxide insufflation.  Camera inspection revealed no injury.  Extra ports were carefully placed under direct laparoscopic visualization.  There is inflammation in the right lower quadrant.  I mobilized the terminal ileum to proximal ascending colon in a lateral to medial fashion.  I took care to avoid injuring any retroperitoneal structures.  Was able to find a very inflamed appendix that was densely adherent to the ileal cecal mesentery.  It was retrocolic.  Had used harmonic dissection to gradually transect through the mesoappendix.  Encountered a fecalith and perforation in the mid body.  The tip fractured off.  I freed the appendix off its attachments to the ascending colon and cecal mesentery.  I elevated the appendix. I skeletonized and transected the mesoappendix. I was able to free off the base of the appendix which was still viable.  I stapled the appendix off the cecum using a laparoscopic stapler. I took a few centimeters of viable cecum to where it was not inflamed at the base and was much more soft.  I placed the appendix inside an EndoCatch bag and removed out the 12 mm port.  I removed the fracture tip out as well.  I had to dilate the fascia port 2.5 cm to get the appendix inside the bag out   I did copious irrigation.  Hemostasis was good in the mesoappendix, colon mesentery, and retroperitoneum. Staple line was intact on the cecum with no bleeding. I washed out the pelvis, retrohepatic space and right paracolic gutter. I washed out the left side as well.  Hemostasis is good. There was no  perforation or injury.  Because the area cleaned up well after irrigation, I did not place a drain.   I closed the 12 mm stapler port site with interrupted #1 PDS stitch using a suture passer under direct laparoscopic visualization.   I aspirated the carbon dioxide. I removed the ports.  I closed skin using 4-0 monocryl stitch.  Sterile dressings applied.  Patient was extubated and sent to the recovery room. I updated the patient's status to the Patient's son.  Recommendations were made.  Questions were answered.  He expressed understanding & appreciation.  Adin Hector, MD, FACS, MASCRS Gastrointestinal and Minimally Invasive Surgery    [address], [place], [place] [address] [phone] Main / Paging [phone] Fax    01/19/2018 5:20 PM

## 2018-01-19 NOTE — Anesthesia Postprocedure Evaluation (Signed)
Anesthesia Post Note  Patient: Jenny Schneider  Procedure(s) Performed: LAPAROSCOPIC APPENDECTOMY (N/A )     Patient location during evaluation: PACU Anesthesia Type: General Level of consciousness: awake and alert Pain management: pain level controlled Vital Signs Assessment: post-procedure vital signs reviewed and stable Respiratory status: spontaneous breathing, nonlabored ventilation, respiratory function stable and patient connected to nasal cannula oxygen Cardiovascular status: blood pressure returned to baseline and stable Postop Assessment: no apparent nausea or vomiting Anesthetic complications: no    Last Vitals:  Vitals:   01/19/18 1818 01/19/18 1918  BP: 109/66 90/64  Pulse: (!) 103 97  Resp: 18 15  Temp: 37 C 37.1 C  SpO2: 95% 94%    Last Pain:  Vitals:   01/19/18 1918  TempSrc: Oral  PainSc:                  [place]et [place]

## 2018-01-19 NOTE — Anesthesia Preprocedure Evaluation (Addendum)
Anesthesia Evaluation  Patient identified by MRN, date of birth, ID band Patient awake    Reviewed: Allergy & Precautions, NPO status , Patient's Chart, lab work & pertinent test results  Airway Mallampati: II  TM Distance: >3 FB Neck ROM: Full    Dental no notable dental hx. (+) Upper Dentures, Missing, Poor Dentition   Pulmonary COPD, Current Smoker,    Pulmonary exam normal breath sounds clear to auscultation       Cardiovascular Exercise Tolerance: Good hypertension, Pt. on medications Normal cardiovascular exam Rhythm:Regular Rate:Normal     Neuro/Psych negative neurological ROS  negative psych ROS   GI/Hepatic negative GI ROS, Neg liver ROS,   Endo/Other  diabetes, Type 2  Renal/GU negative Renal ROS     Musculoskeletal   Abdominal (+) + obese,   Peds  Hematology   Anesthesia Other Findings   Reproductive/Obstetrics                            Lab Results  Component Value Date   CREATININE 0.80 01/19/2018   BUN 13 01/19/2018   NA 137 01/19/2018   K 4.3 01/19/2018   CL 101 01/19/2018   CO2 26 01/19/2018     Lab Results  Component Value Date   WBC 14.8 (H) 01/19/2018   HGB 18.4 (H) 01/19/2018   HCT 54.1 (H) 01/19/2018   MCV 86.1 01/19/2018   PLT 190 01/19/2018    Anesthesia Physical Anesthesia Plan  ASA: III and emergent  Anesthesia Plan: General   Post-op Pain Management:    Induction: Cricoid pressure planned, Rapid sequence and Intravenous  PONV Risk Score and Plan: 3 and Treatment may vary due to age or medical condition, Dexamethasone and Ondansetron  Airway Management Planned: Oral ETT  Additional Equipment:   Intra-op Plan:   Post-operative Plan: Extubation in OR  Informed Consent: I have reviewed the patients History and Physical, chart, labs and discussed the procedure including the risks, benefits and alternatives for the proposed anesthesia with  the patient or authorized representative who has indicated his/her understanding and acceptance.   Dental advisory given  Plan Discussed with: CRNA  Anesthesia Plan Comments:         Anesthesia Quick Evaluation

## 2018-01-19 NOTE — Anesthesia Procedure Notes (Signed)
Procedure Name: Intubation Date/Time: 01/19/2018 3:52 PM Performed by: Dione Booze, CRNA Pre-anesthesia Checklist: Suction available, Patient being monitored, Emergency Drugs available and Patient identified Patient Re-evaluated:Patient Re-evaluated prior to induction Oxygen Delivery Method: Circle system utilized Preoxygenation: Pre-oxygenation with 100% oxygen Induction Type: IV induction, Rapid sequence and Cricoid Pressure applied Laryngoscope Size: Mac and 4 Grade View: Grade I Tube type: Oral Tube size: 7.5 mm Number of attempts: 1 Airway Equipment and Method: Stylet Placement Confirmation: ETT inserted through vocal cords under direct vision,  positive ETCO2 and breath sounds checked- equal and bilateral Secured at: 21 cm Tube secured with: Tape Dental Injury: Teeth and Oropharynx as per pre-operative assessment

## 2018-01-19 NOTE — ED Notes (Signed)
ED TO INPATIENT HANDOFF REPORT  Name/Age/Gender Jenny Schneider 62 y.o. female  Code Status   Home/SNF/Other Home  Chief Complaint LOWER RIGHT ABD PAIN  Level of Care/Admitting Diagnosis ED Disposition    ED Disposition Condition Comment   Admit  The patient appears reasonably stabilized for admission considering the current resources, flow, and capabilities available in the ED at this time, and I doubt any other [facility] requiring further screening and/or treatment in the ED prior to admission is  present.       Medical History Past Medical History:  Diagnosis Date  . Asthma   . Cancer ([place])    skin  . COPD (chronic obstructive pulmonary disease) ([place])   . Diabetes mellitus without complication ([place])   . Hypertension   . IBS (irritable bowel syndrome)     Allergies Allergies  Allergen Reactions  . Beta Adrenergic Blockers     IV Location/Drains/Wounds Patient Lines/Drains/Airways Status   Active Line/Drains/Airways    Name:   Placement date:   Placement time:   Site:   Days:   Peripheral IV 01/19/18 Right Wrist   01/19/18    0820    Wrist   less than 1          Labs/Imaging Results for orders placed or performed during the hospital encounter of 01/19/18 (from the past 48 hour(s))  CBC with Differential/Platelet     Status: Abnormal   Collection Time: 01/19/18  8:22 AM  Result Value Ref Range   WBC 14.8 (H) 4.0 - 10.5 K/uL   RBC 6.28 (H) 3.87 - 5.11 MIL/uL   Hemoglobin 18.4 (H) 12.0 - 15.0 g/dL   HCT 54.1 (H) 36.0 - 46.0 %   MCV 86.1 78.0 - 100.0 fL   MCH 29.3 26.0 - 34.0 pg   MCHC 34.0 30.0 - 36.0 g/dL   RDW 15.5 11.5 - 15.5 %   Platelets 190 150 - 400 K/uL   Neutrophils Relative % 82 %   Neutro Abs 12.0 (H) 1.7 - 7.7 K/uL   Lymphocytes Relative 11 %   Lymphs Abs 1.6 0.7 - 4.0 K/uL   Monocytes Relative 6 %   Monocytes Absolute 0.9 0.1 - 1.0 K/uL   Eosinophils Relative 1 %   Eosinophils Absolute 0.2 0.0 - 0.7 K/uL   Basophils Relative 0 %   Basophils  Absolute 0.0 0.0 - 0.1 K/uL    Comment: Performed at [facility], [address] [place], [place] [address]  Comprehensive metabolic panel     Status: Abnormal   Collection Time: 01/19/18  8:22 AM  Result Value Ref Range   Sodium 137 135 - 145 mmol/L   Potassium 4.3 3.5 - 5.1 mmol/L   Chloride 101 98 - 111 mmol/L    Comment: Please note change in reference range.   CO2 26 22 - 32 mmol/L   Glucose, Bld 121 (H) 70 - 99 mg/dL    Comment: Please note change in reference range.   BUN 13 8 - 23 mg/dL    Comment: Please note change in reference range.   Creatinine, Ser 0.80 0.44 - 1.00 mg/dL   Calcium 9.2 8.9 - 10.3 mg/dL   Total Protein 7.9 6.5 - 8.1 g/dL   Albumin 4.3 3.5 - 5.0 g/dL   AST 21 15 - 41 U/L   ALT 23 0 - 44 U/L    Comment: Please note change in reference range.   Alkaline Phosphatase 85 38 - 126 U/L  Total Bilirubin 0.5 0.3 - 1.2 mg/dL   GFR calc non Af Amer >60 >60 mL/min   GFR calc Af Amer >60 >60 mL/min    Comment: (NOTE) The eGFR has been calculated using the CKD EPI equation. This calculation has not been validated in all clinical situations. eGFR's persistently <60 mL/min signify possible Chronic Kidney Disease.    Anion gap 10 5 - 15    Comment: Performed at [facility], [address] [place], [place] [address]  Lipase, blood     Status: None   Collection Time: 01/19/18  8:22 AM  Result Value Ref Range   Lipase 26 11 - 51 U/L    Comment: Performed at [facility], [address] [place], [place] [address]  Urinalysis, Routine w reflex microscopic     Status: Abnormal   Collection Time: 01/19/18  9:41 AM  Result Value Ref Range   Color, Urine YELLOW YELLOW   APPearance CLEAR CLEAR   Specific Gravity, Urine <1.005 (L) 1.005 - 1.030   pH 6.0 5.0 - 8.0   Glucose, UA NEGATIVE NEGATIVE mg/dL   Hgb urine dipstick TRACE (A) NEGATIVE   Bilirubin Urine NEGATIVE NEGATIVE   Ketones, ur NEGATIVE NEGATIVE mg/dL   Protein, ur  NEGATIVE NEGATIVE mg/dL   Nitrite POSITIVE (A) NEGATIVE   Leukocytes, UA NEGATIVE NEGATIVE    Comment: Performed at [facility], [address] [place], [place] [address]  Urinalysis, Microscopic (reflex)     Status: Abnormal   Collection Time: 01/19/18  9:41 AM  Result Value Ref Range   RBC / HPF 0-5 0 - 5 RBC/hpf   WBC, UA 0-5 0 - 5 WBC/hpf   Bacteria, UA MANY (A) NONE SEEN   Squamous Epithelial / LPF 0-5 0 - 5    Comment: Performed at [facility], [address] [place], [place] [address]   Ct Abdomen Pelvis W Contrast  Result Date: 01/19/2018 CLINICAL DATA:  Right lower quadrant pain, vomiting EXAM: CT ABDOMEN AND PELVIS WITH CONTRAST TECHNIQUE: Multidetector CT imaging of the abdomen and pelvis was performed using the standard protocol following bolus administration of intravenous contrast. CONTRAST:  198m ISOVUE-300 IOPAMIDOL (ISOVUE-300) INJECTION 61% COMPARISON:  None. FINDINGS: Lower chest: No confluent opacities or effusions. Heart is normal size. Irregular noncalcified plaque noted along the posterior wall of the descending thoracic aorta. No aneurysm. Hepatobiliary: Fatty infiltration of the liver. No focal abnormality or biliary ductal dilatation. Prior cholecystectomy Pancreas: No focal abnormality or ductal dilatation. Spleen: No focal abnormality.  Normal size. Adrenals/Urinary Tract: Small cyst in the upper pole of the left kidney. No hydronephrosis. Renovascular calcifications in the renal hila bilaterally. No adrenal or urinary bladder abnormality. Stomach/Bowel: Appendix is dilated, measuring 12 mm with surrounding inflammatory change compatible with appendicitis. Sigmoid diverticulosis. Stomach and small bowel decompressed, unremarkable. Vascular/Lymphatic: Diffuse atherosclerotic calcifications throughout the aorta and branch vessels. Mild irregular noncalcified plaque along the proximal abdominal aorta. No aneurysm or adenopathy. Reproductive: Prior  hysterectomy.  No adnexal masses. Other: No free fluid or free air. Musculoskeletal: No acute bony abnormality. IMPRESSION: Dilated inflamed appendix compatible with acute appendicitis. No evidence of rupture. Fatty liver. Diffuse advanced aortic and branch vessel atherosclerosis. There is irregular noncalcified plaque within the distal descending thoracic aorta and proximal abdominal aorta. Electronically Signed   By: KRolm BaptiseM.D.   On: 01/19/2018 09:34    Pending Labs Unresulted Labs (From admission, onward)  None      Vitals/Pain Today's Vitals   01/19/18 0757 01/19/18 0758 01/19/18 1120 01/19/18 1123  BP:   (!) 145/79   Pulse:   (!) 104   Resp:   14   Temp:    99.5 F (37.5 C)  TempSrc:    Oral  SpO2:   95%   Weight:  218 lb (98.9 kg)    Height:  5' 6"  (1.676 m)    PainSc: 8        Isolation Precautions No active isolations  Medications Medications  0.9 %  sodium chloride infusion ( Intravenous Stopped 01/19/18 0900)  sodium chloride 0.9 % bolus 1,000 mL (0 mLs Intravenous Stopped 01/19/18 1043)  iopamidol (ISOVUE-300) 61 % injection 30 mL (15 mLs Oral Contrast Given 01/19/18 0816)  iopamidol (ISOVUE-300) 61 % injection 100 mL (100 mLs Intravenous Contrast Given 01/19/18 0913)  cefTRIAXone (ROCEPHIN) 2 g in sodium chloride 0.9 % 100 mL IVPB (0 g Intravenous Stopped 01/19/18 1115)    And  metroNIDAZOLE (FLAGYL) IVPB 500 mg (500 mg Intravenous New Bag/Given 01/19/18 1000)    Mobility walks

## 2018-01-19 NOTE — ED Triage Notes (Signed)
Pt has been complaining of R lower flank pain X 3 days

## 2018-01-19 NOTE — ED Notes (Signed)
Pt ambulatory to restroom

## 2018-01-19 NOTE — ED Provider Notes (Signed)
[facility] EMERGENCY DEPARTMENT Provider Note   CSN: [id number] Arrival date & time: 01/19/18  0746     History   Chief Complaint Chief Complaint  Patient presents with  . Flank Pain    HPI Jenny Schneider is a 62 y.o. female.  Patient is a 62 year old female with a history of COPD, diabetes, hypertension and IBS presenting today with 3 days of gradually worsening right lower quadrant pain.  She states initially the pain was sharp and would come and go and in the last 24 hours the Pain has become constant.  Patient states when she walks, coughs or hits a bump while driving in the car the pain is worse.  The pain is now starting to radiate into her lower back on both sides.  She denies any bowel changes but did have one episode of nausea and vomiting this morning.  She has not had any fever.  No dysuria, frequency or urgency.  Patient has had a prior complete hysterectomy but no other abdominal surgeries.  The history is provided by the patient.  Abdominal Pain   This is a new problem. Episode onset: 3 days ago. The problem occurs constantly. The problem has been gradually worsening. The pain is associated with an unknown factor. The pain is located in the RLQ. The quality of the pain is cramping, colicky and sharp. The pain is at a severity of 8/10. The pain is severe. Associated symptoms include nausea and vomiting. Pertinent negatives include fever, diarrhea, constipation, dysuria and frequency. The symptoms are aggravated by coughing, activity and palpation. Relieved by: being still. Past medical history comments: IBS.    Past Medical History:  Diagnosis Date  . Asthma   . Cancer ([place])    skin  . COPD (chronic obstructive pulmonary disease) ([place])   . Diabetes mellitus without complication ([place])   . Hypertension   . IBS (irritable bowel syndrome)     There are no active problems to display for this patient.   Past Surgical History:  Procedure Laterality Date  .  ABDOMINAL HYSTERECTOMY       OB History   None      Home Medications    Prior to Admission medications   Medication Sig Start Date End Date Taking? Authorizing Provider  albuterol (PROVENTIL HFA;VENTOLIN HFA) 108 (90 Base) MCG/ACT inhaler Inhale into the lungs every 6 (six) hours as needed for wheezing or shortness of breath.   Yes [provider]  dicyclomine (BENTYL) 20 MG tablet Take 20 mg by mouth once.   Yes [provider]  glipiZIDE (GLUCOTROL) 5 MG tablet Take by mouth daily before breakfast.   Yes [provider]  lisinopril (PRINIVIL,ZESTRIL) 10 MG tablet Take 10 mg by mouth 2 (two) times daily.   Yes [provider]    Family History History reviewed. No pertinent family history.  Social History Social History   Tobacco Use  . Smoking status: Current Every Day Smoker    Packs/day: 1.00    Types: Cigarettes  . Smokeless tobacco: Never Used  Substance Use Topics  . Alcohol use: Not on file  . Drug use: Not on file     Allergies   Beta adrenergic blockers   Review of Systems Review of Systems  Constitutional: Negative for fever.  Respiratory:       Hx of COPD and states she wheezes all the time.  She denies any SOB or new cough today  Gastrointestinal: Positive for abdominal pain, nausea  and vomiting. Negative for constipation and diarrhea.  Genitourinary: Negative for dysuria and frequency.  All other systems reviewed and are negative.    Physical Exam Updated Vital Signs BP (!) 162/82 (BP Location: Right Arm)   Pulse 98   Temp 98.4 F (36.9 C) (Oral)   Resp 20   Ht 5\' 6"  (1.676 m)   Wt 98.9 kg (218 lb)   SpO2 94%   BMI 35.19 kg/m   Physical Exam  Constitutional: She is oriented to person, place, and time. She appears well-developed and well-nourished. No distress.  HENT:  Head: Normocephalic and atraumatic.  Eyes: Pupils are equal, round, and reactive to light. EOM are normal.  Cardiovascular: Normal  rate, regular rhythm, normal heart sounds and intact distal pulses. Exam reveals no friction rub.  No murmur heard. Pulmonary/Chest: Effort normal. She has wheezes. She has no rales.  Diffuse wheezing without tachypnea or accessory muscle use  Abdominal: Soft. Bowel sounds are normal. She exhibits no distension. There is tenderness in the right lower quadrant. There is rebound and guarding. There is no CVA tenderness.  Musculoskeletal: Normal range of motion. She exhibits no tenderness.  No edema  Neurological: She is alert and oriented to person, place, and time. No cranial nerve deficit.  Skin: Skin is warm and dry. No rash noted.  Psychiatric: She has a normal mood and affect. Her behavior is normal.  Nursing note and vitals reviewed.    ED Treatments / Results  Labs (all labs ordered are listed, but only abnormal results are displayed) Labs Reviewed  CBC WITH DIFFERENTIAL/PLATELET - Abnormal; Notable for the following components:      Result Value   WBC 14.8 (*)    RBC 6.28 (*)    Hemoglobin 18.4 (*)    HCT 54.1 (*)    Neutro Abs 12.0 (*)    All other components within normal limits  COMPREHENSIVE METABOLIC PANEL - Abnormal; Notable for the following components:   Glucose, Bld 121 (*)    All other components within normal limits  LIPASE, BLOOD  URINALYSIS, ROUTINE W REFLEX MICROSCOPIC    EKG None  Radiology Ct Abdomen Pelvis W Contrast  Result Date: 01/19/2018 CLINICAL DATA:  Right lower quadrant pain, vomiting EXAM: CT ABDOMEN AND PELVIS WITH CONTRAST TECHNIQUE: Multidetector CT imaging of the abdomen and pelvis was performed using the standard protocol following bolus administration of intravenous contrast. CONTRAST:  119mL ISOVUE-300 IOPAMIDOL (ISOVUE-300) INJECTION 61% COMPARISON:  None. FINDINGS: Lower chest: No confluent opacities or effusions. Heart is normal size. Irregular noncalcified plaque noted along the posterior wall of the descending thoracic aorta. No  aneurysm. Hepatobiliary: Fatty infiltration of the liver. No focal abnormality or biliary ductal dilatation. Prior cholecystectomy Pancreas: No focal abnormality or ductal dilatation. Spleen: No focal abnormality.  Normal size. Adrenals/Urinary Tract: Small cyst in the upper pole of the left kidney. No hydronephrosis. Renovascular calcifications in the renal hila bilaterally. No adrenal or urinary bladder abnormality. Stomach/Bowel: Appendix is dilated, measuring 12 mm with surrounding inflammatory change compatible with appendicitis. Sigmoid diverticulosis. Stomach and small bowel decompressed, unremarkable. Vascular/Lymphatic: Diffuse atherosclerotic calcifications throughout the aorta and branch vessels. Mild irregular noncalcified plaque along the proximal abdominal aorta. No aneurysm or adenopathy. Reproductive: Prior hysterectomy.  No adnexal masses. Other: No free fluid or free air. Musculoskeletal: No acute bony abnormality. IMPRESSION: Dilated inflamed appendix compatible with acute appendicitis. No evidence of rupture. Fatty liver. Diffuse advanced aortic and branch vessel atherosclerosis. There is irregular noncalcified plaque within the distal  descending thoracic aorta and proximal abdominal aorta. Electronically Signed   By: Rolm Baptise M.D.   On: 01/19/2018 09:34    Procedures Procedures (including critical care time)  Medications Ordered in ED Medications  0.9 %  sodium chloride infusion (has no administration in time range)     Initial Impression / Assessment and Plan / ED Course  I have reviewed the triage vital signs and the nursing notes.  Pertinent labs & imaging results that were available during my care of the patient were reviewed by me and considered in my medical decision making (see chart for details).    62 year old female presenting with right lower quadrant pain with rebound and guarding concerning for appendicitis.  She has no urinary or vaginal complaints.  She has  prior complete hysterectomy so ovarian torsion is not possible.  Patient's features based on her history are less likely to be a kidney stone.  She denies any urinary symptoms suggestive of a UTI or pyelonephritis.  She has no CVA tenderness.  She denies any cardiac or respiratory symptoms today.  She did have one episode of vomiting but is refusing pain medication because it makes her sick.  She drinks some diet Dr. Malachi Bonds on the way to the hospital today but has not eaten since last night. Patient made n.p.o.  CBC, CMP, lipase, UA, CT abdomen and pelvis pending and patient started on normal saline.  9:27 AM Labs consistent with leukocytosis of 14.8 and hemeconcentration of 18.  Pt given IVF bolus of saline.  CMP and lipase wnl.  CT pending.   9:49 AM CT is consistent with appendicitis.  Will contact general surgery.  Patient treated with antibiotics.  Final Clinical Impressions(s) / ED Diagnoses   Final diagnoses:  Acute appendicitis, unspecified acute appendicitis type    ED Discharge Orders    None       Blanchie Dessert, MD 01/19/18 [phone]

## 2018-01-19 NOTE — ED Notes (Signed)
Bed: MO70 Expected date:  Expected time:  Means of arrival:  Comments: [place] from [facility]

## 2018-01-19 NOTE — ED Provider Notes (Signed)
Patient transferred in from med center [facility].  Patient with 3 days of abdominal pain right lower quadrant pain localized yesterday.  CT scan shows evidence of appendicitis.  Patient referred in to be seen by Dr. gross for appendectomy.  Patient's urinalysis also positive nitrite as well.  The patient has received antibiotics that will most likely cover any concerns there.  Will contact Dr. gross to let him know that patient has arrived.   Fredia Sorrow, MD 01/19/18 1126

## 2018-01-20 ENCOUNTER — Encounter (HOSPITAL_COMMUNITY): Payer: Self-pay | Admitting: Surgery

## 2018-01-20 LAB — CBC
HEMATOCRIT: 42.3 % (ref 36.0–46.0)
Hemoglobin: 13.7 g/dL (ref 12.0–15.0)
MCH: 29.5 pg (ref 26.0–34.0)
MCHC: 32.4 g/dL (ref 30.0–36.0)
MCV: 91 fL (ref 78.0–100.0)
Platelets: 176 10*3/uL (ref 150–400)
RBC: 4.65 MIL/uL (ref 3.87–5.11)
RDW: 14.7 % (ref 11.5–15.5)
WBC: 11.2 10*3/uL — AB (ref 4.0–10.5)

## 2018-01-20 LAB — BASIC METABOLIC PANEL
Anion gap: 10 (ref 5–15)
BUN: 14 mg/dL (ref 8–23)
CALCIUM: 8.1 mg/dL — AB (ref 8.9–10.3)
CHLORIDE: 99 mmol/L (ref 98–111)
CO2: 26 mmol/L (ref 22–32)
Creatinine, Ser: 1.04 mg/dL — ABNORMAL HIGH (ref 0.44–1.00)
GFR calc non Af Amer: 56 mL/min — ABNORMAL LOW (ref >60)
Glucose, Bld: 84 mg/dL (ref 70–99)
Potassium: 3.7 mmol/L (ref 3.5–5.1)
Sodium: 135 mmol/L (ref 135–145)

## 2018-01-20 LAB — GLUCOSE, CAPILLARY
GLUCOSE-CAPILLARY: 117 mg/dL — AB (ref 70–99)
Glucose-Capillary: 85 mg/dL (ref 70–99)
Glucose-Capillary: 89 mg/dL (ref 70–99)

## 2018-01-20 LAB — HIV ANTIBODY (ROUTINE TESTING W REFLEX): HIV Screen 4th Generation wRfx: NONREACTIVE

## 2018-01-20 MED ORDER — IBUPROFEN 200 MG PO TABS
400.0000 mg | ORAL_TABLET | Freq: Four times a day (QID) | ORAL | 0 refills | Status: AC | PRN
Start: 2018-01-20 — End: ?

## 2018-01-20 MED ORDER — ACETAMINOPHEN 500 MG PO TABS: ORAL_TABLET | ORAL | 0 refills | Status: AC

## 2018-01-20 MED ORDER — METHOCARBAMOL 500 MG PO TABS: 500.0000 mg | ORAL_TABLET | Freq: Three times a day (TID) | ORAL | Status: DC | PRN

## 2018-01-20 MED ORDER — HYDROCHLOROTHIAZIDE 12.5 MG PO TABS: ORAL_TABLET | ORAL | 3 refills | Status: AC

## 2018-01-20 MED ORDER — SACCHAROMYCES BOULARDII 250 MG PO CAPS
250.0000 mg | ORAL_CAPSULE | Freq: Two times a day (BID) | ORAL | Status: DC
  Administered 2018-01-20: 250 mg via ORAL
  Filled 2018-01-20: qty 1

## 2018-01-20 MED ORDER — IBUPROFEN 400 MG PO TABS: 400.0000 mg | ORAL_TABLET | Freq: Four times a day (QID) | ORAL | Status: DC | PRN

## 2018-01-20 MED ORDER — AMOXICILLIN-POT CLAVULANATE 875-125 MG PO TABS: 1.0000 | ORAL_TABLET | Freq: Two times a day (BID) | ORAL | 0 refills | Status: AC

## 2018-01-20 MED ORDER — SACCHAROMYCES BOULARDII 250 MG PO CAPS: ORAL_CAPSULE | ORAL | Status: DC

## 2018-01-20 MED ORDER — TRAMADOL HCL 50 MG PO TABS: 50.0000 mg | ORAL_TABLET | Freq: Four times a day (QID) | ORAL | Status: DC | PRN

## 2018-01-20 MED ORDER — TRAMADOL HCL 50 MG PO TABS: ORAL_TABLET | ORAL | 0 refills | Status: DC

## 2018-01-20 MED ORDER — LISINOPRIL 10 MG PO TABS: ORAL_TABLET | ORAL | Status: DC

## 2018-01-20 NOTE — Plan of Care (Signed)
Lpan of care discussed

## 2018-01-20 NOTE — Discharge Instructions (Signed)
SURGERY: POST OP INSTRUCTIONS (Surgery for small bowel obstruction, colon resection, etc)  If you have nausea, vomiting, trouble voiding, fever, increased pain; or if you just feel bad and not sure what is going on call and let us see you AS Soon As Possible, or send you to the Emergency department.  EAT Gradually transition to a high fiber diet with a fiber supplement over the next few days after discharge  WALK Walk an hour a day.  Control your pain to do that.    CONTROL PAIN Control pain so that you can walk, sleep, tolerate sneezing/coughing, go up/down stairs.  HAVE A BOWEL MOVEMENT DAILY Keep your bowels regular to avoid problems.  OK to try a laxative to override constipation.  OK to use an antidairrheal to slow down diarrhea.  Call if not better after 2 tries  CALL IF YOU HAVE PROBLEMS/CONCERNS Call if you are still struggling despite following these instructions. Call if you have concerns not answered by these instructions  ######################################################################   DIET Follow a light diet the first few days at home.  Start with a bland diet such as soups, liquids, starchy foods, low fat foods, etc.  If you feel full, bloated, or constipated, stay on a ful liquid or pureed/blenderized diet for a few days until you feel better and no longer constipated. Be sure to drink plenty of fluids every day to avoid getting dehydrated (feeling dizzy, not urinating, etc.). Gradually add a fiber supplement to your diet over the next week.  Gradually get back to a regular solid diet.  Avoid fast food or heavy meals the first week as you are more likely to get nauseated. It is expected for your digestive tract to need a few months to get back to normal.  It is common for your bowel movements and stools to be irregular.  You will have occasional bloating and cramping that should eventually fade away.  Until you are eating solid food normally, off all pain  medications, and back to regular activities; your bowels will not be normal. Focus on eating a low-fat, high fiber diet the rest of your life (See Getting to [place], below).  CARE of your INCISION or WOUND It is good for closed incision and even open wounds to be washed every day.  Shower every day.  Short baths are fine.  Wash the incisions and wounds clean with soap & water.    If you have a closed incision(s), wash the incision with soap & water every day.  You may leave closed incisions open to air if it is dry.   You may cover the incision with clean gauze & replace it after your daily shower for comfort. If you have skin tapes (Steristrips) or skin glue (Dermabond) on your incision, leave them in place.  They will fall off on their own like a scab.  You may trim any edges that curl up with clean scissors.  If you have staples, set up an appointment for them to be removed in the office in 10 days after surgery.  If you have a drain, wash around the skin exit site with soap & water and place a new dressing of gauze or band aid around the skin every day.  Keep the drain site clean & dry.    If you have an open wound with packing, see wound care instructions.  In general, it is encouraged that you remove your dressing and packing, shower with soap & water,  and replace your dressing once a day.  Pack the wound with clean gauze moistened with normal (0.9%) saline to keep the wound moist & uninfected.  Pressure on the dressing for 30 minutes will stop most wound bleeding.  Eventually your body will heal & pull the open wound closed over the next few months.  Raw open wounds will occasionally bleed or secrete yellow drainage until it heals closed.  Drain sites will drain a little until the drain is removed.  Even closed incisions can have mild bleeding or drainage the first few days until the skin edges scab over & seal.   If you have an open wound with a wound vac, see wound vac care  instructions.     ACTIVITIES as tolerated Start light daily activities --- self-care, walking, climbing stairs-- beginning the day after surgery.  Gradually increase activities as tolerated.  Control your pain to be active.  Stop when you are tired.  Ideally, walk several times a day, eventually an hour a day.   Most people are back to most day-to-day activities in a few weeks.  It takes 4-8 weeks to get back to unrestricted, intense activity. If you can walk 30 minutes without difficulty, it is safe to try more intense activity such as jogging, treadmill, bicycling, low-impact aerobics, swimming, etc. Save the most intensive and strenuous activity for last (Usually 4-8 weeks after surgery) such as sit-ups, heavy lifting, contact sports, etc.  Refrain from any intense heavy lifting or straining until you are off narcotics for pain control.  You will have off days, but things should improve week-by-week. DO NOT PUSH THROUGH PAIN.  Let pain be your guide: If it hurts to do something, don't do it.  Pain is your body warning you to avoid that activity for another week until the pain goes down. You may drive when you are no longer taking narcotic prescription pain medication, you can comfortably wear a seatbelt, and you can safely make sudden turns/stops to protect yourself without hesitating due to pain. You may have sexual intercourse when it is comfortable. If it hurts to do something, stop.  MEDICATIONS Take your usually prescribed home medications unless otherwise directed.   Blood thinners:  Usually you can restart any strong blood thinners after the second postoperative day.  It is OK to take aspirin right away.     If you are on strong blood thinners (warfarin/Coumadin, Plavix, Xerelto, Eliquis, Pradaxa, etc), discuss with your surgeon, medicine PCP, and/or cardiologist for instructions on when to restart the blood thinner & if blood monitoring is needed (PT/INR blood check, etc).     PAIN  CONTROL Pain after surgery or related to activity is often due to strain/injury to muscle, tendon, nerves and/or incisions.  This pain is usually short-term and will improve in a few months.  To help speed the process of healing and to get back to regular activity more quickly, DO THE FOLLOWING THINGS TOGETHER: 1. Increase activity gradually.  DO NOT PUSH THROUGH PAIN 2. Use Ice and/or Heat 3. Try Gentle Massage and/or Stretching 4. Take over the counter pain medication 5. Take Narcotic prescription pain medication for more severe pain  Good pain control = faster recovery.  It is better to take more medicine to be more active than to stay in bed all day to avoid medications. 1.  Increase activity gradually Avoid heavy lifting at first, then increase to lifting as tolerated over the next 6 weeks. Do not push through the  pain.  Listen to your body and avoid positions and maneuvers than reproduce the pain.  Wait a few days before trying something more intense Walking an hour a day is encouraged to help your body recover faster and more safely.  Start slowly and stop when getting sore.  If you can walk 30 minutes without stopping or pain, you can try more intense activity (running, jogging, aerobics, cycling, swimming, treadmill, sex, sports, weightlifting, etc.) Remember: If it hurts to do it, then dont do it! 2. Use Ice and/or Heat You will have swelling and bruising around the incisions.  This will take several weeks to resolve. Ice packs or heating pads (6-8 times a day, 30-60 minutes at a time) will help sooth soreness & bruising. Some people prefer to use ice alone, heat alone, or alternate between ice & heat.  Experiment and see what works best for you.  Consider trying ice for the first few days to help decrease swelling and bruising; then, switch to heat to help relax sore spots and speed recovery. Shower every day.  Short baths are fine.  It feels good!  Keep the incisions and wounds  clean with soap & water.   3. Try Gentle Massage and/or Stretching Massage at the area of pain many times a day Stop if you feel pain - do not overdo it 4. Take over the counter pain medication This helps the muscle and nerve tissues become less irritable and calm down faster Choose ONE of the following over-the-counter anti-inflammatory medications: Acetaminophen 500mg  tabs (Tylenol) 1-2 pills with every meal and just before bedtime (avoid if you have liver problems or if you have acetaminophen in you narcotic prescription) Naproxen 220mg  tabs (ex. Aleve, Naprosyn) 1-2 pills twice a day (avoid if you have kidney, stomach, IBD, or bleeding problems) Ibuprofen 200mg  tabs (ex. Advil, Motrin) 3-4 pills with every meal and just before bedtime (avoid if you have kidney, stomach, IBD, or bleeding problems) Take with food/snack several times a day as directed for at least 2 weeks to help keep pain / soreness down & more manageable. 5. Take Narcotic prescription pain medication for more severe pain A prescription for strong pain control is often given to you upon discharge (for example: oxycodone/Percocet, hydrocodone/Norco/Vicodin, or tramadol/Ultram) Take your pain medication as prescribed. Be mindful that most narcotic prescriptions contain Tylenol (acetaminophen) as well - avoid taking too much Tylenol. If you are having problems/concerns with the prescription medicine (does not control pain, nausea, vomiting, rash, itching, etc.), please call us [phone] to see if we need to switch you to a different pain medicine that will work better for you and/or control your side effects better. If you need a refill on your pain medication, you must call the office before 4 pm and on weekdays only.  By federal law, prescriptions for narcotics cannot be called into a pharmacy.  They must be filled out on paper & picked up from our office by the patient or authorized caretaker.  Prescriptions cannot be filled  after 4 pm nor on weekends.    WHEN TO CALL us [phone] Severe uncontrolled or worsening pain  Fever over 101 F (38.5 C) Concerns with the incision: Worsening pain, redness, rash/hives, swelling, bleeding, or drainage Reactions / problems with new medications (itching, rash, hives, nausea, etc.) Nausea and/or vomiting Difficulty urinating Difficulty breathing Worsening fatigue, dizziness, lightheadedness, blurred vision Other concerns If you are not getting better after two weeks or are noticing you are getting worse,  contact our office [phone] for further advice.  We may need to adjust your medications, re-evaluate you in the office, send you to the emergency room, or see what other things we can do to help. The clinic staff is available to answer your questions during regular business hours (8:30am-5pm).  Please dont hesitate to call and ask to speak to one of our nurses for clinical concerns.    A surgeon from [facility] is always on call at the hospitals 24 hours/day If you have a medical emergency, go to the nearest emergency room or call 911.  FOLLOW UP in our office One the day of your discharge from the hospital (or the next business weekday), please call [facility] to set up or confirm an appointment to see your surgeon in the office for a follow-up appointment.  Usually it is 2-3 weeks after your surgery.   If you have skin staples at your incision(s), let the office know so we can set up a time in the office for the nurse to remove them (usually around 10 days after surgery). Make sure that you call for appointments the day of discharge (or the next business weekday) from the hospital to ensure a convenient appointment time. IF YOU HAVE DISABILITY OR FAMILY LEAVE FORMS, BRING THEM TO THE OFFICE FOR PROCESSING.  DO NOT GIVE THEM TO YOUR DOCTOR.  [facility] [address] [place], [place], [place]  [address] ? [phone] - Main [phone] - [place],  [phone] - Fax [email or web address]  GETTING TO GOOD BOWEL HEALTH. It is expected for your digestive tract to need a few months to get back to normal.  It is common for your bowel movements and stools to be irregular.  You will have occasional bloating and cramping that should eventually fade away.  Until you are eating solid food normally, off all pain medications, and back to regular activities; your bowels will not be normal.   Avoiding constipation The goal: ONE SOFT BOWEL MOVEMENT A DAY!    Drink plenty of fluids.  Choose water first. TAKE A FIBER SUPPLEMENT EVERY DAY THE REST OF YOUR LIFE During your first week back home, gradually add back a fiber supplement every day Experiment which form you can tolerate.   There are many forms such as powders, tablets, wafers, gummies, etc Psyllium bran (Metamucil), methylcellulose (Citrucel), Miralax or Glycolax, Benefiber, Flax Seed.  Adjust the dose week-by-week (1/2 dose/day to 6 doses a day) until you are moving your bowels 1-2 times a day.  Cut back the dose or try a different fiber product if it is giving you problems such as diarrhea or bloating. Sometimes a laxative is needed to help jump-start bowels if constipated until the fiber supplement can help regulate your bowels.  If you are tolerating eating & you are farting, it is okay to try a gentle laxative such as double dose MiraLax, prune juice, or Milk of Magnesia.  Avoid using laxatives too often. Stool softeners can sometimes help counteract the constipating effects of narcotic pain medicines.  It can also cause diarrhea, so avoid using for too long. If you are still constipated despite taking fiber daily, eating solids, and a few doses of laxatives, call our office. Controlling diarrhea Try drinking liquids and eating bland foods for a few days to avoid stressing your intestines further. Avoid dairy products (especially milk &  ice cream) for a short time.  The intestines often can  lose the ability to digest lactose when stressed. Avoid foods that cause gassiness or bloating.  Typical foods include beans and other legumes, cabbage, broccoli, and dairy foods.  Avoid greasy, spicy, fast foods.  Every person has some sensitivity to other foods, so listen to your body and avoid those foods that trigger problems for you. Probiotics (such as active yogurt, Align, etc) may help repopulate the intestines and colon with normal bacteria and calm down a sensitive digestive tract Adding a fiber supplement gradually can help thicken stools by absorbing excess fluid and retrain the intestines to act more normally.  Slowly increase the dose over a few weeks.  Too much fiber too soon can backfire and cause cramping & bloating. It is okay to try and slow down diarrhea with a few doses of antidiarrheal medicines.   Bismuth subsalicylate (ex. Kayopectate, Pepto Bismol) for a few doses can help control diarrhea.  Avoid if pregnant.   Loperamide (Imodium) can slow down diarrhea.  Start with one tablet (2mg ) first.  Avoid if you are having fevers or severe pain.  ILEOSTOMY PATIENTS WILL HAVE CHRONIC DIARRHEA since their colon is not in use.    Drink plenty of liquids.  You will need to drink even more glasses of water/liquid a day to avoid getting dehydrated. Record output from your ileostomy.  Expect to empty the bag every 3-4 hours at first.  Most people with a permanent ileostomy empty their bag 4-6 times at the least.   Use antidiarrheal medicine (especially Imodium) several times a day to avoid getting dehydrated.  Start with a dose at bedtime & breakfast.  Adjust up or down as needed.  Increase antidiarrheal medications as directed to avoid emptying the bag more than 8 times a day (every 3 hours). Work with your wound ostomy nurse to learn care for your ostomy.  See ostomy care instructions. TROUBLESHOOTING IRREGULAR BOWELS 1) Start with a  soft & bland diet. No spicy, greasy, or fried foods.  2) Avoid gluten/wheat or dairy products from diet to see if symptoms improve. 3) Miralax 17gm or flax seed mixed in [place]. water or juice-daily. May use 2-4 times a day as needed. 4) Gas-X, Phazyme, etc. as needed for gas & bloating.  5) Prilosec (omeprazole) over-the-counter as needed 6)  Consider probiotics (Align, Activa, etc) to help calm the bowels down  Call your doctor if you are getting worse or not getting better.  Sometimes further testing (cultures, endoscopy, X-ray studies, CT scans, bloodwork, etc.) may be needed to help diagnose and treat the cause of the diarrhea. [facility], [place], [place], [place], [place]  [address] [phone] - Main.    [phone]  - Toll Free.   [phone] - Fax [email or web address]    Appendicitis The appendix is a finger-shaped tube that is attached to the large intestine. Appendicitis is inflammation of the appendix. Without treatment, appendicitis can cause the appendix to tear (rupture). A ruptured appendix can lead to a life-threatening infection. It can also lead to the formation of a painful collection of pus (abscess) in the appendix. What are the causes? This condition may be caused by a blockage in the appendix that leads to infection. The blockage can be due to:  A ball of stool.  Enlarged lymph glands.  In some cases, the cause may not be known. What increases the risk? This condition is more likely to develop in people who are 106-70 years of age. What are the  signs or symptoms? Symptoms of this condition include:  Pain around the belly button that moves toward the lower right abdomen. The pain can become more severe as time passes. It gets worse with coughing or sudden movements.  Tenderness in the lower right abdomen.  Nausea.  Vomiting.  Loss of appetite.  Fever.  Constipation.  Diarrhea.  Generally not feeling  well.  How is this diagnosed? This condition may be diagnosed with:  A physical exam.  Blood tests.  Urine test.  To confirm the diagnosis, an ultrasound, MRI, or CT scan may be done. How is this treated? This condition is usually treated by taking out the appendix (appendectomy). There are two methods for doing an appendectomy:  Open appendectomy. In this surgery, the appendix is removed through a large cut (incision) that is made in the lower right abdomen. This procedure may be recommended if: ? You have major scarring from a previous surgery. ? You have a bleeding disorder. ? You are pregnant and are near term. ? You have a condition that makes the laparoscopic procedure impossible, such as an advanced infection or a ruptured appendix.  Laparoscopic appendectomy. In this surgery, the appendix is removed through small incisions. This procedure usually causes less pain and fewer problems than an open appendectomy. It also has a shorter recovery time.  If the appendix has ruptured and an abscess has formed, a drain may be placed into the abscess to remove fluid and antibiotic medicines may be given through an IV tube. The appendix may or may not need to be removed. This information is not intended to replace advice given to you by your health care provider. Make sure you discuss any questions you have with your health care provider. Document Released: 07/05/2005 Document Revised: 11/12/2015 Document Reviewed: 11/20/2014 Elsevier Interactive Patient Education  2017 [employer].

## 2018-01-20 NOTE — Progress Notes (Signed)
She feels good, tolerating diet, still a little orthostatic, but feels fine standing.  She wants to go home and she promises to drink at least a quart of water tonight.  Pt. Along with her daughter and 2 grandchildren instructed in what to look for with perforated appendix.  She knows to call and get follow up if she has any issues.

## 2018-01-20 NOTE — Progress Notes (Signed)
Patient and family given discharge instructions and prescriptions. No questions or concerns at this time.

## 2018-01-20 NOTE — Progress Notes (Signed)
1 Day Post-Op    CC:  Abdominal pain  Subjective: she looks really good this morning.  She is up and ambulating some.  Her blood pressure is down a little bit but she does not seem to be symptomatic from it.  She is hungry and would like some real food.  She has normal postop abdominal tenderness; port site dressings are all dry.  Objective: Vital signs in last 24 hours: Temp:  [98.6 F (37 C)-101 F (38.3 C)] 98.9 F (37.2 C) (07/05 0519) Pulse Rate:  [72-107] 74 (07/05 0519) Resp:  [14-24] 14 (07/05 0519) BP: (90-145)/(55-111) 96/66 (07/05 0620) SpO2:  [90 %-98 %] 95 % (07/05 0519) Last BM Date: 01/19/18 1740 PO 3000 IV Urine 1000 Afebrile, BP 162/82 on admit =>> 90/50 range since last PM  HR 70's NO labs Admit UA - nitrate positive  Admit CT:  Dilated inflamed appendix compatible with acute appendicitis. No evidence of rupture.  Fatty liver. Diffuse advanced aortic and branch vessel atherosclerosis. There is irregular noncalcified plaque within the distal descending thoracic aorta and proximal abdominal aorta.  Intake/Output from previous day: 07/04 0701 - 07/05 0700 In: 5010 [P.O.:1740; I.V.:920; IV Piggyback:1150] Out: 1050 [Urine:1000; Blood:50] Intake/Output this shift: No intake/output data recorded.  General appearance: alert, cooperative and no distress Resp: clear to auscultation bilaterally GI: Soft, sore, port sites all have dry dressings over them.  She is up ambulating and tolerating liquids well.  Lab Results:  Recent Labs    01/19/18 0822  WBC 14.8*  HGB 18.4*  HCT 54.1*  PLT 190    BMET Recent Labs    01/19/18 0822  NA 137  K 4.3  CL 101  CO2 26  GLUCOSE 121*  BUN 13  CREATININE 0.80  CALCIUM 9.2   PT/INR No results for input(s): LABPROT, INR in the last 72 hours.  Recent Labs  Lab 01/19/18 0822  AST 21  ALT 23  ALKPHOS 85  BILITOT 0.5  PROT 7.9  ALBUMIN 4.3     Lipase     Component Value Date/Time   LIPASE 26  01/19/2018 0822   Prior to Admission medications   Medication Sig Start Date End Date Taking? Authorizing Provider  albuterol (PROVENTIL HFA;VENTOLIN HFA) 108 (90 Base) MCG/ACT inhaler Inhale into the lungs every 6 (six) hours as needed for wheezing or shortness of breath.   Yes [provider]  aspirin EC 81 MG tablet Take 81 mg by mouth daily.   Yes [provider]  citalopram (CELEXA) 10 MG tablet Take 10 mg by mouth daily. 11/27/17  Yes [provider]  dicyclomine (BENTYL) 20 MG tablet Take 20 mg by mouth daily.    Yes [provider]  fexofenadine (ALLEGRA) 180 MG tablet Take 180 mg by mouth daily.   Yes [provider]  glipiZIDE (GLUCOTROL) 5 MG tablet Take 5 mg by mouth daily before breakfast.    Yes [provider]  hydrochlorothiazide (HYDRODIURIL) 12.5 MG tablet Take 12.5 mg by mouth 4 (four) times a week. 11/27/17  Yes [provider]  ibuprofen (ADVIL,MOTRIN) 200 MG tablet Take 400 mg by mouth every 6 (six) hours as needed for moderate pain.   Yes [provider]  lisinopril (PRINIVIL,ZESTRIL) 10 MG tablet Take 10 mg by mouth 2 (two) times daily.   Yes [provider]  pseudoephedrine (SUDAFED) 60 MG tablet Take 60 mg by mouth every 4 (four) hours as needed for congestion.   Yes [provider]  SPIRIVA HANDIHALER 18 MCG inhalation capsule Place 18 mcg into inhaler and inhale daily. 12/27/17  Yes [provider]      Medications: . acetaminophen  1,000 mg Oral TID  . aspirin EC  81 mg Oral Daily  . Chlorhexidine Gluconate Cloth  6 each Topical Once  . citalopram  10 mg Oral Daily  . dicyclomine  20 mg Oral Daily  . enoxaparin (LOVENOX) injection  40 mg Subcutaneous Q24H  . fluconazole  200 mg Oral Daily  . gabapentin  300 mg Oral BID  . glipiZIDE  5 mg Oral QAC breakfast  . [START ON 01/21/2018] hydrochlorothiazide  12.5 mg Oral Once per day on Sun Tue Thu Sat  . insulin aspart   0-15 Units Subcutaneous TID WC  . insulin aspart  0-5 Units Subcutaneous QHS  . lip balm  1 application Topical BID  . lisinopril  10 mg Oral BID  . loratadine  10 mg Oral Daily  . nystatin cream  1 application Topical BID  . psyllium  1 packet Oral Daily  . tiotropium  18 mcg Inhalation Daily   . sodium chloride 100 mL/hr at 01/20/18 0800  . lactated ringers    . methocarbamol (ROBAXIN)  IV    . piperacillin-tazobactam (ZOSYN)  IV Stopped (01/20/18 0809)   Assessment/Plan Hx of IBS Hypertension - hold lisinopril/hctz today Diabetes - glucose stable COPD  Acute Gangrenous Appendicitis with perforation Laparoscopic appendectomy 01/19/2018, Dr. Michael Boston  FEN:  IV fluids/dys1 ID: Rocephin/Flagyl preop; Diflucan 7/4 =>>day 2; Zosyn =>> day 2 DVT: Lovenox ordered/patient refused Follow up:  TBD  Plan: Continue current IV antibiotics.  Advance her diet, recheck labs this a.m.  She continues to do well through the day and with p.o. antibiotics consider discharge home with a another week of oral antibiotics.  We have discussed the concern of a late forming abscess with a perforated gangrenous appendix.      LOS: 0 days    Kaydin Labo 01/20/2018 [phone]

## 2018-01-23 NOTE — Discharge Summary (Signed)
Physician Discharge Summary  Patient ID: Jenny Schneider MRN: [id number] DOB/AGE: 1955/08/14 62 y.o.  Admit date: 01/19/2018 Discharge date: 01/20/2018  Admission Diagnoses:  Acute appendicitis Hx of IBS Hypertension - hold lisinopril/hctz today Diabetes - glucose stable COPD    Discharge Diagnoses:  Acute gangrenous appendicitis with perforation Hx of IBS Hypertension - hold lisinopril/hctz today Diabetes - glucose stable COPD     Principal Problem:   Acute gangrenous appendicitis with perforation s/p lap appendectomy 01/19/2018 Active Problems:   IBS (irritable bowel syndrome)   Hypertension   Diabetes mellitus without complication (HCC)   COPD (chronic obstructive pulmonary disease) (HCC)   Obesity (BMI 30-39.9)   Tobacco abuse   Narcotic-induced nausea and vomiting - intolerance to narcotics   PROCEDURES: Laparoscopic appendectomy 01/19/2018, Dr. Cyndia Bent Course:  Chief complaint / Reason for evaluation: Abdominal pain.  Probable appendicitis.  Pleasant obese diabetic woman controlled on oral hypoglycemics.  History of irregular bowels.  Presumed to be irritable bowel syndrome on Bentyl.  Patient noticed crampy abdominal pain and nausea starting 3 days ago.  Initially she thought it was an irritable bowel flare but things did not improve with Bentyl and other interventions.  Pain became more intensified.  Focused in the right lower quadrant.  Decreased appetite.  Nausea.  Had vomiting this morning.  Became more intense.  Was concerned.  Went to the emergency room at [facility]  Exam and CT scan concern for appendicitis.  Surgical consultation requested.  Patient transferred to [facility] long hospital her surgeon was available.  Her son is at her bedside.  She recently relocated from [place] to live with her daughter.  She has had cholecystectomy and hysterectomy.  Not recently.  She normally can walk several miles a day.  She does smoke.  No personal  nor family history of GI/colon cancer, inflammatory bowel disease, allergy such as Celiac Sprue, dietary/dairy problems, colitis, ulcers nor gastritis.  No recent sick contacts/gastroenteritis.  No travel outside the country.  No changes in diet.  No dysphagia to solids or liquids.  No significant heartburn or reflux.  No hematochezia, hematemesis, coffee ground emesis.  No evidence of prior gastric/peptic ulceration.  Patient was seen in the ED and taken the operating room by Dr. Alwyn Pea.  Patient tolerated the procedure well.  She was placed on IV antibiotics preop and these were continued until discharge.  The following a.m. she looked amazingly good.  She was mobilized and her diet was advanced.  She felt good and was ultimately decided to discharge her home in the afternoon of her first postoperative day.  The risk of a post procedure abscess was discussed with the patient in detail.  There is also explained to her family members present.  She was sent home with an additional week of antibiotic therapy as noted below.  She did well and was ultimately discharged home on a.m. of her first postoperative day.  Her blood pressure was down and we did orthostatics before she was discharged.  She did well with this.  We explained that she should not restart her lisinopril or hydrochlorothiazide until she had a blood pressure greater than 100/60.  She is to record her blood pressures at home and call her primary care for further instructions on when to restart her blood pressure medicines.  Condition on discharge: Improved.  CBC Latest Ref Rng & Units 01/20/2018 01/19/2018  WBC 4.0 - 10.5 K/uL 11.2(H) 14.8(H)  Hemoglobin 12.0 -  15.0 g/dL 13.7 18.4(H)  Hematocrit 36.0 - 46.0 % 42.3 54.1(H)  Platelets 150 - 400 K/uL 176 190   CMP Latest Ref Rng & Units 01/20/2018 01/19/2018  Glucose 70 - 99 mg/dL 84 121(H)  BUN 8 - 23 mg/dL 14 13  Creatinine 0.44 - 1.00 mg/dL 1.04(H) 0.80  Sodium 135 - 145 mmol/L 135 137   Potassium 3.5 - 5.1 mmol/L 3.7 4.3  Chloride 98 - 111 mmol/L 99 101  CO2 22 - 32 mmol/L 26 26  Calcium 8.9 - 10.3 mg/dL 8.1(L) 9.2  Total Protein 6.5 - 8.1 g/dL - 7.9  Total Bilirubin 0.3 - 1.2 mg/dL - 0.5  Alkaline Phos 38 - 126 U/L - 85  AST 15 - 41 U/L - 21  ALT 0 - 44 U/L - 23    Disposition:    Allergies as of 01/20/2018      Reactions   Beta Adrenergic Blockers    Wheezing      Medication List    TAKE these medications   acetaminophen 500 MG tablet Commonly known as:  TYLENOL You can take 1000 mg of Tylenol every 8 hours.  Use this as your first treatment for pain.  Continue as long as you need pain medicine.  You can buy this over the counter at any drug store.   You can alternate this with ibuprofen and Tramadol if you need a third pain medicine.   albuterol 108 (90 Base) MCG/ACT inhaler Commonly known as:  PROVENTIL HFA;VENTOLIN HFA Inhale into the lungs every 6 (six) hours as needed for wheezing or shortness of breath.   amoxicillin-clavulanate 875-125 MG tablet Commonly known as:  AUGMENTIN Take 1 tablet by mouth 2 (two) times daily for 7 days.   aspirin EC 81 MG tablet Take 81 mg by mouth daily.   citalopram 10 MG tablet Commonly known as:  CELEXA Take 10 mg by mouth daily.   dicyclomine 20 MG tablet Commonly known as:  BENTYL Take 20 mg by mouth daily.   fexofenadine 180 MG tablet Commonly known as:  ALLEGRA Take 180 mg by mouth daily.   glipiZIDE 5 MG tablet Commonly known as:  GLUCOTROL Take 5 mg by mouth daily before breakfast.   hydrochlorothiazide 12.5 MG tablet Commonly known as:  HYDRODIURIL Check your blood pressure on in AM before taking your blood pressure medicines, this is one.  Be sure BP is more than 100/60 before restarting this medicine.    Call your primary care with your blood pressure if you are not sure. Your normal dose is one daily. What changed:    how much to take  how to take this  when to take this  additional  instructions   ibuprofen 200 MG tablet Commonly known as:  ADVIL,MOTRIN Take 2-3 tablets (400-600 mg total) by mouth every 6 (six) hours as needed for moderate pain (Do not exceed 3 tablets every 6 hours, you can alternate this with Plain Tylenol). What changed:    how much to take  reasons to take this   lisinopril 10 MG tablet Commonly known as:  PRINIVIL,ZESTRIL Be sure your blood pressure is greater than 100/60 before restarting this drug at 10 mg per day. What changed:    how much to take  how to take this  when to take this  additional instructions   pseudoephedrine 60 MG tablet Commonly known as:  SUDAFED Take 60 mg by mouth every 4 (four) hours as needed for congestion.  saccharomyces boulardii 250 MG capsule Commonly known as:  FLORASTOR This is a probiotic to help replace gut bacteria killed by the antibiotics.  You can buy this over the counter at any drugstore.  It will be with the stool related products like fiber.  Follow package instructions and take for the next 28 days.   SPIRIVA HANDIHALER 18 MCG inhalation capsule Generic drug:  tiotropium Place 18 mcg into inhaler and inhale daily.   traMADol 50 MG tablet Commonly known as:  ULTRAM You can take 1 tablet every 6 hours as needed for pain not relieved by oral Tylenol and ibuprofen.  If you hare having a lot of pain you should call and get them to check it out.      Follow-up Information    [facility], [place] Follow up.   Specialty:  General Surgery Why:  Office will call you with follow up appointment.  If you have fever, increased pain, cannot eat, trouble voiding or just feel bad and not sure why call the office and have them see you ASAP.  Bring photo ID and insurance information with you.  Contact information: [address] [id number] [phone]        Call your primary care and let him help you with home medicine issues Follow up.   Why:  let them know about  your blood pressures at home and tell you when to restart blood pressure medicine.  Let them recheck your labs next week.   Contact information: Let them know you had surgery.           SignedEarnstine Regal 01/23/2018, 1:01 PM

## 2020-02-02 ENCOUNTER — Other Ambulatory Visit (HOSPITAL_BASED_OUTPATIENT_CLINIC_OR_DEPARTMENT_OTHER): Payer: Self-pay | Admitting: Family Medicine

## 2020-02-02 ENCOUNTER — Ambulatory Visit (HOSPITAL_BASED_OUTPATIENT_CLINIC_OR_DEPARTMENT_OTHER)
Admission: RE | Admit: 2020-02-02 | Discharge: 2020-02-02 | Disposition: A | Discharge status: Home / Self Care | Payer: BC Managed Care – PPO | Source: Ambulatory Visit | Attending: Family Medicine | Admitting: Family Medicine

## 2020-02-02 ENCOUNTER — Other Ambulatory Visit: Payer: Self-pay

## 2020-02-02 DIAGNOSIS — M546 Pain in thoracic spine: Secondary | ICD-10-CM | POA: Diagnosis present

## 2020-02-02 DIAGNOSIS — M545 Low back pain, unspecified: Secondary | ICD-10-CM

## 2020-03-03 ENCOUNTER — Other Ambulatory Visit: Payer: Self-pay

## 2020-03-03 DIAGNOSIS — I714 Abdominal aortic aneurysm, without rupture, unspecified: Secondary | ICD-10-CM

## 2020-03-03 NOTE — Addendum Note (Signed)
Addended byDoylene Bode on: 03/03/2020 03:17 PM   Modules accepted: Orders

## 2020-03-04 ENCOUNTER — Other Ambulatory Visit: Payer: Self-pay

## 2020-03-04 DIAGNOSIS — I714 Abdominal aortic aneurysm, without rupture, unspecified: Secondary | ICD-10-CM

## 2020-03-04 DIAGNOSIS — I739 Peripheral vascular disease, unspecified: Secondary | ICD-10-CM

## 2020-03-07 ENCOUNTER — Encounter: Payer: Self-pay | Admitting: Vascular Surgery

## 2020-03-07 ENCOUNTER — Ambulatory Visit (HOSPITAL_COMMUNITY)
Admission: RE | Admit: 2020-03-07 | Discharge: 2020-03-07 | Disposition: A | Discharge status: Home / Self Care | Payer: BC Managed Care – PPO | Source: Ambulatory Visit | Attending: Vascular Surgery | Admitting: Vascular Surgery

## 2020-03-07 ENCOUNTER — Other Ambulatory Visit: Payer: Self-pay

## 2020-03-07 ENCOUNTER — Ambulatory Visit (INDEPENDENT_AMBULATORY_CARE_PROVIDER_SITE_OTHER): Payer: BC Managed Care – PPO | Admitting: Vascular Surgery

## 2020-03-07 ENCOUNTER — Ambulatory Visit (INDEPENDENT_AMBULATORY_CARE_PROVIDER_SITE_OTHER)
Admission: RE | Admit: 2020-03-07 | Discharge: 2020-03-07 | Disposition: A | Discharge status: Home / Self Care | Payer: BC Managed Care – PPO | Source: Ambulatory Visit | Attending: Vascular Surgery | Admitting: Vascular Surgery

## 2020-03-07 VITALS — BP 144/86 | HR 85 | Temp 97.8°F | Resp 20 | Ht 66.0 in | Wt 224.0 lb

## 2020-03-07 DIAGNOSIS — I700 Atherosclerosis of aorta: Secondary | ICD-10-CM

## 2020-03-07 DIAGNOSIS — I714 Abdominal aortic aneurysm, without rupture, unspecified: Secondary | ICD-10-CM

## 2020-03-07 DIAGNOSIS — I739 Peripheral vascular disease, unspecified: Secondary | ICD-10-CM | POA: Insufficient documentation

## 2020-03-07 MED ORDER — ROSUVASTATIN CALCIUM 10 MG PO TABS: 10.0000 mg | ORAL_TABLET | Freq: Every day | ORAL | 3 refills | Status: AC

## 2020-03-07 NOTE — Progress Notes (Signed)
Patient ID: Jenny Schneider, female   DOB: [birth year], 64 y.o.   MRN: [id number]  Reason for Consult: New Patient (Initial Visit)   Referred by Jenny Linsey, MD  Subjective:     HPI:  Jenny Schneider is a 64 y.o. female has diabetes, hypertension is a longtime smoker.  She is sent for evaluation of aortic atherosclerosis.  She has never had stroke TIA or amaurosis.  She denies any previous history of heart disease.  She does have issues with walking states that her legs get very heavy after a few minutes of walking she is able to stop a feel much better.  She is thought that this is related to her back disease which she was undergoing work-up for.  She states that she has disc disease from her cervical spine through her lumbar spine has never had any intervention for this.  She denies any history of vascular surgery.  She does not have any tissue loss or ulceration.  She does take aspirin has taken a statin in the past but could not tolerate due to nausea but is open to trying again.  Past Medical History:  Diagnosis Date  . Asthma   . Cancer ([place])    skin  . COPD (chronic obstructive pulmonary disease) ([place])   . Diabetes mellitus without complication ([place])   . Hypertension   . IBS (irritable bowel syndrome)    History reviewed. No pertinent family history. Past Surgical History:  Procedure Laterality Date  . ABDOMINAL HYSTERECTOMY    . CHOLECYSTECTOMY  1998  . LAPAROSCOPIC APPENDECTOMY N/A 01/19/2018   Procedure: LAPAROSCOPIC APPENDECTOMY;  Surgeon: Michael Boston, MD;  Location: [facility] ORS;  Service: General;  Laterality: N/A;    Short Social History:  Social History   Tobacco Use  . Smoking status: Current Every Day Smoker    Packs/day: 1.00    Types: Cigarettes  . Smokeless tobacco: Never Used  Substance Use Topics  . Alcohol use: Not Currently    Allergies  Allergen Reactions  . Beta Adrenergic Blockers     Wheezing     Current Outpatient Medications  Medication Sig Dispense  Refill  . acetaminophen (TYLENOL) 500 MG tablet You can take 1000 mg of Tylenol every 8 hours.  Use this as your first treatment for pain.  Continue as long as you need pain medicine.  You can buy this over the counter at any drug store.   You can alternate this with ibuprofen and Tramadol if you need a third pain medicine. 30 tablet 0  . albuterol (PROVENTIL HFA;VENTOLIN HFA) 108 (90 Base) MCG/ACT inhaler Inhale into the lungs every 6 (six) hours as needed for wheezing or shortness of breath.    Marland Kitchen aspirin EC 81 MG tablet Take 81 mg by mouth daily.    . citalopram (CELEXA) 10 MG tablet Take 10 mg by mouth daily.  11  . dicyclomine (BENTYL) 20 MG tablet Take 20 mg by mouth daily.     . fexofenadine (ALLEGRA) 180 MG tablet Take 180 mg by mouth daily.    Marland Kitchen glipiZIDE (GLUCOTROL) 5 MG tablet Take 5 mg by mouth daily before breakfast.     . hydrochlorothiazide (HYDRODIURIL) 12.5 MG tablet Check your blood pressure on in AM before taking your blood pressure medicines, this is one.  Be sure BP is more than 100/60 before restarting this medicine.    Call your primary care with your blood pressure if you are not sure. Your normal dose is one  daily.  3  . ibuprofen (ADVIL,MOTRIN) 200 MG tablet Take 2-3 tablets (400-600 mg total) by mouth every 6 (six) hours as needed for moderate pain (Do not exceed 3 tablets every 6 hours, you can alternate this with Plain Tylenol). 30 tablet 0  . lisinopril (ZESTRIL) 20 MG tablet Take 20 mg by mouth 2 (two) times daily.    Viviana Simpler ELLIPTA 200-62.5-25 MCG/INH AEPB      No current facility-administered medications for this visit.    Review of Systems  Constitutional:  Constitutional negative. HENT: HENT negative.  Eyes: Eyes negative.  Respiratory: Positive for shortness of breath.  Cardiovascular: Positive for claudication.  GI: Gastrointestinal negative.  Musculoskeletal: Positive for back pain, gait problem and leg pain.  Hematologic: Hematologic/lymphatic  negative.  Psychiatric: Psychiatric negative.        Objective:  Objective   Vitals:   03/07/20 0929  BP: (!) 144/86  Pulse: 85  Resp: 20  Temp: 97.8 F (36.6 C)  SpO2: 93%  Weight: 224 lb (101.6 kg)  Height: 5\' 6"  (1.676 m)   Body mass index is 36.15 kg/m.  Physical Exam HENT:     Head: Normocephalic.     Nose:     Comments: Wearing a mask Eyes:     Pupils: Pupils are equal, round, and reactive to light.  Neck:     Vascular: No carotid bruit.  Cardiovascular:     Rate and Rhythm: Normal rate.     Pulses:          Radial pulses are 2+ on the right side and 2+ on the left side.       Femoral pulses are 2+ on the right side and 2+ on the left side.      Dorsalis pedis pulses are 1+ on the right side and 1+ on the left side.     Heart sounds: No murmur heard.   Pulmonary:     Effort: Pulmonary effort is normal.     Breath sounds: Normal breath sounds.  Abdominal:     General: Abdomen is flat.     Palpations: Abdomen is soft. There is no mass.  Musculoskeletal:        General: No swelling.     Cervical back: Normal range of motion and neck supple.  Skin:    General: Skin is warm and dry.     Capillary Refill: Capillary refill takes less than 2 seconds.  Neurological:     General: No focal deficit present.     Mental Status: She is alert.  Psychiatric:        Mood and Affect: Mood normal.        Behavior: Behavior normal.        Thought Content: Thought content normal.        Judgment: Judgment normal.     Data: I have independently interpreted her ABIs to be 0.92 right with digital pressure 101 waveforms are biphasic on the left ABIs 0.91 triphasic and digital pressure 83  I have independently interpreted her abdominal aortic study.  Maximum diameter is 2.74 cm transverse there is calcified plaque in her right external iliac and common femoral artery and distal aorta.     Assessment/Plan:     64 year old female sent for evaluation of aortic  atherosclerosis.  Together we reviewed his CT scan from 2019 which does demonstrate significant calcium as well as soft thrombus.  She does take aspirin I have added Crestor 10 mg.  In the  past she could not take statin drugs due to nausea.  If Crestor causes issues we will refer her for evaluation in the lipid clinic.  We will also get her to follow-up in 3 to 4 months with exercise studies.  I have counseled her on smoking cessation for approximately 5 minutes and we discussed that this is likely the cause of much of her lower extremity issues and certainly her aortic atherosclerosis.  She does not want any medications for this today but certainly we could assist her in the future.  She will follow-up in 3 to 4 months.  I have recommended a walking program as well.     Waynetta Sandy MD [facility]

## 2020-03-11 ENCOUNTER — Other Ambulatory Visit: Payer: Self-pay | Admitting: *Deleted

## 2020-03-11 DIAGNOSIS — I739 Peripheral vascular disease, unspecified: Secondary | ICD-10-CM

## 2020-06-02 ENCOUNTER — Encounter (HOSPITAL_COMMUNITY): Payer: BC Managed Care – PPO

## 2020-06-02 ENCOUNTER — Ambulatory Visit: Payer: BC Managed Care – PPO

## 2020-09-12 ENCOUNTER — Telehealth: Payer: Self-pay | Admitting: *Deleted

## 2020-09-12 NOTE — Telephone Encounter (Signed)
Per referral 09/10/20 - Dr. Andree Elk - called patient lvm of upcoming apts and mailed calendar with welcome packet.

## 2020-10-03 ENCOUNTER — Other Ambulatory Visit: Payer: Self-pay | Admitting: Family

## 2020-10-03 DIAGNOSIS — D751 Secondary polycythemia: Secondary | ICD-10-CM

## 2020-10-06 ENCOUNTER — Encounter: Payer: Self-pay | Admitting: Family

## 2020-10-06 ENCOUNTER — Inpatient Hospital Stay: Payer: BC Managed Care – PPO | Attending: Family

## 2020-10-06 ENCOUNTER — Other Ambulatory Visit: Payer: Self-pay

## 2020-10-06 ENCOUNTER — Inpatient Hospital Stay (HOSPITAL_BASED_OUTPATIENT_CLINIC_OR_DEPARTMENT_OTHER): Payer: BC Managed Care – PPO | Admitting: Family

## 2020-10-06 DIAGNOSIS — Z7982 Long term (current) use of aspirin: Secondary | ICD-10-CM | POA: Diagnosis not present

## 2020-10-06 DIAGNOSIS — I10 Essential (primary) hypertension: Secondary | ICD-10-CM | POA: Diagnosis not present

## 2020-10-06 DIAGNOSIS — K589 Irritable bowel syndrome without diarrhea: Secondary | ICD-10-CM | POA: Insufficient documentation

## 2020-10-06 DIAGNOSIS — Z85828 Personal history of other malignant neoplasm of skin: Secondary | ICD-10-CM | POA: Insufficient documentation

## 2020-10-06 DIAGNOSIS — J449 Chronic obstructive pulmonary disease, unspecified: Secondary | ICD-10-CM | POA: Diagnosis not present

## 2020-10-06 DIAGNOSIS — Z7984 Long term (current) use of oral hypoglycemic drugs: Secondary | ICD-10-CM | POA: Diagnosis not present

## 2020-10-06 DIAGNOSIS — E119 Type 2 diabetes mellitus without complications: Secondary | ICD-10-CM | POA: Diagnosis not present

## 2020-10-06 DIAGNOSIS — D751 Secondary polycythemia: Secondary | ICD-10-CM

## 2020-10-06 DIAGNOSIS — Z79899 Other long term (current) drug therapy: Secondary | ICD-10-CM | POA: Insufficient documentation

## 2020-10-06 DIAGNOSIS — F17210 Nicotine dependence, cigarettes, uncomplicated: Secondary | ICD-10-CM | POA: Diagnosis not present

## 2020-10-06 LAB — CMP (CANCER CENTER ONLY)
ALT: 27 U/L (ref 0–44)
AST: 20 U/L (ref 15–41)
Albumin: 4.5 g/dL (ref 3.5–5.0)
Alkaline Phosphatase: 70 U/L (ref 38–126)
Anion gap: 8 (ref 5–15)
BUN: 17 mg/dL (ref 8–23)
CO2: 32 mmol/L (ref 22–32)
Calcium: 10.5 mg/dL — ABNORMAL HIGH (ref 8.9–10.3)
Chloride: 98 mmol/L (ref 98–111)
Creatinine: 0.93 mg/dL (ref 0.44–1.00)
GFR, Estimated: >60 mL/min (ref >60)
Glucose, Bld: 103 mg/dL — ABNORMAL HIGH (ref 70–99)
Potassium: 4.4 mmol/L (ref 3.5–5.1)
Sodium: 138 mmol/L (ref 135–145)
Total Bilirubin: 0.3 mg/dL (ref 0.3–1.2)
Total Protein: 7.5 g/dL (ref 6.5–8.1)

## 2020-10-06 LAB — RETICULOCYTES
Immature Retic Fract: 7.7 % (ref 2.3–15.9)
RBC.: 6.25 MIL/uL — ABNORMAL HIGH (ref 3.87–5.11)
Retic Count, Absolute: 98.1 10*3/uL (ref 19.0–186.0)
Retic Ct Pct: 1.6 % (ref 0.4–3.1)

## 2020-10-06 LAB — CBC WITH DIFFERENTIAL (CANCER CENTER ONLY)
Abs Immature Granulocytes: 0.05 10*3/uL (ref 0.00–0.07)
Basophils Absolute: 0.1 10*3/uL (ref 0.0–0.1)
Basophils Relative: 1 %
Eosinophils Absolute: 0.3 10*3/uL (ref 0.0–0.5)
Eosinophils Relative: 3 %
HCT: 54.2 % — ABNORMAL HIGH (ref 36.0–46.0)
Hemoglobin: 18.1 g/dL — ABNORMAL HIGH (ref 12.0–15.0)
Immature Granulocytes: 0 %
Lymphocytes Relative: 30 %
Lymphs Abs: 3.6 10*3/uL (ref 0.7–4.0)
MCH: 29.3 pg (ref 26.0–34.0)
MCHC: 33.4 g/dL (ref 30.0–36.0)
MCV: 87.7 fL (ref 80.0–100.0)
Monocytes Absolute: 0.9 10*3/uL (ref 0.1–1.0)
Monocytes Relative: 8 %
Neutro Abs: 7.1 10*3/uL (ref 1.7–7.7)
Neutrophils Relative %: 58 %
Platelet Count: 240 10*3/uL (ref 150–400)
RBC: 6.18 MIL/uL — ABNORMAL HIGH (ref 3.87–5.11)
RDW: 13.2 % (ref 11.5–15.5)
WBC Count: 12.1 10*3/uL — ABNORMAL HIGH (ref 4.0–10.5)
nRBC: 0 % (ref 0.0–0.2)

## 2020-10-06 LAB — SAVE SMEAR (SSMR)

## 2020-10-06 LAB — LACTATE DEHYDROGENASE: LDH: 134 U/L (ref 98–192)

## 2020-10-06 NOTE — Progress Notes (Signed)
Hematology/Oncology Consultation   Name: Rajni Holsworth      MRN: [id number]    Location: Room/bed info not found  Date: 10/06/2020 Time:1:26 PM   REFERRING PHYSICIAN: Ancil Linsey, MD  REASON FOR CONSULT: Secondary polycythemia    DIAGNOSIS: Erythrocytosis, lab work-up pending   HISTORY OF PRESENT ILLNESS: Ms. 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  She is a smoker 1 ppd.  No known history of sleep apnea.  She denies hormone replacement.  She had hysterectomy in 1995. She has a son and daughter, no history of miscarriage.  She notes fatigue and itchy dry skin. Complexion is ruddy.  No fever, chills, n/v, cough, rash, dizziness, blurred/loss of vision, SOB, chest pain, palpitations, abdominal pain/bloating or changes in bowel or bladder habits.  No hot flashes or night sweats.  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 Camelia Eng.  She has occasional puffiness in her feet after a long day at work. This resolves with propping up her feet.  She has history of recurrent basal cell carcinoma of the nose and is still deciding on surgery to remove.  Familial history of cancer includes: sister - pancreatic, paternal grandpa - skin cancer, paternal aunt - kidney.  Her brother has history of hepatitis with liver failure and is on the list for transplant.  Her mother and 2 of her brothers have passed away from heart attacks.  No ETOH or recreational drug use.  She has maintained a good appetite and is staying well hydrated. Her weight is stable.  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   ROS: All other 10 point review of systems is negative.   PAST MEDICAL HISTORY:   Past Medical History:  Diagnosis Date  . Asthma   . Cancer ([place])    skin  . COPD (chronic obstructive pulmonary disease) ([place])   . Diabetes mellitus without  complication ([place])   . Hypertension   . IBS (irritable bowel syndrome)     ALLERGIES: Allergies  Allergen Reactions  . Beta Adrenergic Blockers     Wheezing       MEDICATIONS:  Current Outpatient Medications on File Prior to Visit  Medication Sig Dispense Refill  . acetaminophen (TYLENOL) 500 MG tablet You can take 1000 mg of Tylenol every 8 hours.  Use this as your first treatment for pain.  Continue as long as you need pain medicine.  You can buy this over the counter at any drug store.   You can alternate this with ibuprofen and Tramadol if you need a third pain medicine. 30 tablet 0  . albuterol (PROVENTIL HFA;VENTOLIN HFA) 108 (90 Base) MCG/ACT inhaler Inhale into the lungs every 6 (six) hours as needed for wheezing or shortness of breath.    Marland Kitchen aspirin EC 81 MG tablet Take 81 mg by mouth daily.    . citalopram (CELEXA) 10 MG tablet Take 10 mg by mouth daily.  11  . dicyclomine (BENTYL) 20 MG tablet Take 20 mg by mouth daily.     . fexofenadine (ALLEGRA) 180 MG tablet Take 180 mg by mouth daily.    Marland Kitchen glipiZIDE (GLUCOTROL) 5 MG tablet Take 5 mg by mouth daily before breakfast.     . hydrochlorothiazide (HYDRODIURIL) 12.5 MG tablet Check your blood pressure on in AM before taking your  blood pressure medicines, this is one.  Be sure BP is more than 100/60 before restarting this medicine.    Call your primary care with your blood pressure if you are not sure. Your normal dose is one daily.  3  . ibuprofen (ADVIL,MOTRIN) 200 MG tablet Take 2-3 tablets (400-600 mg total) by mouth every 6 (six) hours as needed for moderate pain (Do not exceed 3 tablets every 6 hours, you can alternate this with Plain Tylenol). 30 tablet 0  . lisinopril (ZESTRIL) 20 MG tablet Take 20 mg by mouth 2 (two) times daily.    . rosuvastatin (CRESTOR) 10 MG tablet Take 1 tablet (10 mg total) by mouth daily. 30 tablet 3  . [place] 200-62.5-25 MCG/INH AEPB      No current facility-administered medications on  file prior to visit.     PAST SURGICAL HISTORY Past Surgical History:  Procedure Laterality Date  . ABDOMINAL HYSTERECTOMY    . CHOLECYSTECTOMY  1998  . LAPAROSCOPIC APPENDECTOMY N/A 01/19/2018   Procedure: LAPAROSCOPIC APPENDECTOMY;  Surgeon: Michael Boston, MD;  Location: [facility];  Service: General;  Laterality: N/A;    FAMILY HISTORY: No family history on file.  SOCIAL HISTORY:  reports that she has been smoking cigarettes. She has been smoking about 1.00 pack per day. She has never used smokeless tobacco. She reports previous alcohol use. She reports that she does not use drugs.  PERFORMANCE STATUS: The patient's performance status is 1 - Symptomatic but completely ambulatory  PHYSICAL EXAM: Most Recent Vital Signs: There were no vitals taken for this visit. BP (!) 147/65 (BP Location: Left Arm, Patient Position: Sitting)   Pulse 83   Temp 97.9 F (36.6 C) (Oral)   Resp 19   Ht 5\' 6"  (1.676 m)   Wt 217 lb 1.9 oz (98.5 kg)   SpO2 97%   BMI 35.04 kg/m   General Appearance:    Alert, cooperative, no distress, appears stated age  Head:    Normocephalic, without obvious abnormality, atraumatic  Eyes:    PERRL, conjunctiva/corneas clear, EOM's intact, fundi    benign, both eyes        Throat:   Lips, mucosa, and tongue normal; teeth and gums normal  Neck:   Supple, symmetrical, trachea midline, no adenopathy;    thyroid:  no enlargement/tenderness/nodules; no carotid   bruit or JVD  Back:     Symmetric, no curvature, ROM normal, no CVA tenderness  Lungs:     Clear to auscultation bilaterally, respirations unlabored  Chest Wall:    No tenderness or deformity   Heart:    Regular rate and rhythm, S1 and S2 normal, no murmur, rub   or gallop     Abdomen:     Soft, non-tender, bowel sounds active all four quadrants,    no masses, no organomegaly        Extremities:   Extremities normal, atraumatic, no cyanosis or edema  Pulses:   2+ and symmetric all extremities  Skin:    Skin color, texture, turgor normal, no rashes or lesions  Lymph nodes:   Cervical, supraclavicular, and axillary nodes normal  Neurologic:   CNII-XII intact, normal strength, sensation and reflexes    throughout    LABORATORY DATA:  Results for orders placed or performed in visit on 10/06/20 (from the past 48 hour(s))  CBC with Differential ([place] Only)     Status: Abnormal   Collection Time: 10/06/20  1:03 PM  Result Value Ref  Range   WBC Count 12.1 (H) 4.0 - 10.5 K/uL   RBC 6.18 (H) 3.87 - 5.11 MIL/uL   Hemoglobin 18.1 (H) 12.0 - 15.0 g/dL   HCT 54.2 (H) 36.0 - 46.0 %   MCV 87.7 80.0 - 100.0 fL   MCH 29.3 26.0 - 34.0 pg   MCHC 33.4 30.0 - 36.0 g/dL   RDW 13.2 11.5 - 15.5 %   Platelet Count 240 150 - 400 K/uL   nRBC 0.0 0.0 - 0.2 %   Neutrophils Relative % 58 %   Neutro Abs 7.1 1.7 - 7.7 K/uL   Lymphocytes Relative 30 %   Lymphs Abs 3.6 0.7 - 4.0 K/uL   Monocytes Relative 8 %   Monocytes Absolute 0.9 0.1 - 1.0 K/uL   Eosinophils Relative 3 %   Eosinophils Absolute 0.3 0.0 - 0.5 K/uL   Basophils Relative 1 %   Basophils Absolute 0.1 0.0 - 0.1 K/uL   Immature Granulocytes 0 %   Abs Immature Granulocytes 0.05 0.00 - 0.07 K/uL    Comment: Performed at [facility] at [facility], [address] [place], [place] [address]  Save Smear [facility])     Status: None   Collection Time: 10/06/20  1:03 PM  Result Value Ref Range   Smear Review SMEAR STAINED AND AVAILABLE FOR REVIEW     Comment: Performed at [facility] at [facility], [address], [place], [place] [address]  Reticulocytes     Status: Abnormal   Collection Time: 10/06/20  1:04 PM  Result Value Ref Range   Retic Ct Pct 1.6 0.4 - 3.1 %   RBC. 6.25 (H) 3.87 - 5.11 MIL/uL   Retic Count, Absolute 98.1 19.0 - 186.0 K/uL   Immature Retic Fract 7.7 2.3 - 15.9 %    Comment: Performed at [facility] at [facility], [address]  [place], [place] [address]      RADIOGRAPHY: No results found.     PATHOLOGY: None  ASSESSMENT/PLAN: Ms. 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  JAK 2 and iron studies are pending to rule out polycythemia or possible hemochromatosis.  She is a smoker so this may be secondary polycythemia.  She is considering quitting smoking.  We will see what her blood work reveals and set her up on a phlebotomy program if needed. For now we will hold off.  Lab work was reviewed with Dr. Marin Olp and he did review her blood smear. No abnormality or evidence of malignancy noted.  We will go ahead and plan to see her again in another 2 months.   All questions were answered and she is in agreement with the plan. She can contact our office with any questions or concerns. We can certainly see her sooner if needed.   The patient was discussed with Dr. Marin Olp and he is in agreement with the aforementioned.   Laverna Peace, NP

## 2020-10-07 ENCOUNTER — Telehealth: Payer: Self-pay | Admitting: *Deleted

## 2020-10-07 ENCOUNTER — Other Ambulatory Visit: Payer: Self-pay | Admitting: Family

## 2020-10-07 LAB — FERRITIN: Ferritin: 116 ng/mL (ref 11–307)

## 2020-10-07 LAB — IRON AND TIBC
Iron: 62 ug/dL (ref 41–142)
Saturation Ratios: 17 % — ABNORMAL LOW (ref 21–57)
TIBC: 378 ug/dL (ref 236–444)
UIBC: 315 ug/dL (ref 120–384)

## 2020-10-07 NOTE — Telephone Encounter (Signed)
Per los 10/06/20 -Called and lvm of upcoming appointments - mailed calendar.

## 2020-10-13 LAB — JAK 2 V617F (GENPATH)

## 2020-10-14 ENCOUNTER — Encounter: Payer: Self-pay | Admitting: *Deleted

## 2020-12-04 ENCOUNTER — Telehealth: Payer: Self-pay

## 2020-12-04 NOTE — Telephone Encounter (Signed)
Pt called and left a vm to cancel and r/s her 12/05/20 appt, called and left a vm with a new appt    Underwood Paone

## 2020-12-05 ENCOUNTER — Inpatient Hospital Stay: Payer: BC Managed Care – PPO

## 2020-12-05 ENCOUNTER — Inpatient Hospital Stay: Payer: BC Managed Care – PPO | Admitting: Family

## 2020-12-30 ENCOUNTER — Other Ambulatory Visit: Payer: Self-pay | Admitting: Family

## 2020-12-30 DIAGNOSIS — D751 Secondary polycythemia: Secondary | ICD-10-CM

## 2020-12-31 ENCOUNTER — Inpatient Hospital Stay: Payer: BC Managed Care – PPO | Attending: Family

## 2020-12-31 ENCOUNTER — Encounter: Payer: Self-pay | Admitting: Family

## 2020-12-31 ENCOUNTER — Telehealth: Payer: Self-pay | Admitting: *Deleted

## 2020-12-31 ENCOUNTER — Inpatient Hospital Stay: Payer: BC Managed Care – PPO | Admitting: Family

## 2020-12-31 ENCOUNTER — Other Ambulatory Visit: Payer: Self-pay

## 2020-12-31 VITALS — BP 127/71 | HR 96 | Temp 98.9°F | Resp 18 | Wt 223.0 lb

## 2020-12-31 DIAGNOSIS — D508 Other iron deficiency anemias: Secondary | ICD-10-CM

## 2020-12-31 DIAGNOSIS — R059 Cough, unspecified: Secondary | ICD-10-CM | POA: Diagnosis not present

## 2020-12-31 DIAGNOSIS — R0683 Snoring: Secondary | ICD-10-CM | POA: Diagnosis not present

## 2020-12-31 DIAGNOSIS — D751 Secondary polycythemia: Secondary | ICD-10-CM | POA: Insufficient documentation

## 2020-12-31 LAB — CBC WITH DIFFERENTIAL (CANCER CENTER ONLY)
Abs Immature Granulocytes: 0.03 10*3/uL (ref 0.00–0.07)
Basophils Absolute: 0.1 10*3/uL (ref 0.0–0.1)
Basophils Relative: 1 %
Eosinophils Absolute: 0.3 10*3/uL (ref 0.0–0.5)
Eosinophils Relative: 3 %
HCT: 50.3 % — ABNORMAL HIGH (ref 36.0–46.0)
Hemoglobin: 16.6 g/dL — ABNORMAL HIGH (ref 12.0–15.0)
Immature Granulocytes: 0 %
Lymphocytes Relative: 26 %
Lymphs Abs: 2.9 10*3/uL (ref 0.7–4.0)
MCH: 29.3 pg (ref 26.0–34.0)
MCHC: 33 g/dL (ref 30.0–36.0)
MCV: 88.7 fL (ref 80.0–100.0)
Monocytes Absolute: 1 10*3/uL (ref 0.1–1.0)
Monocytes Relative: 8 %
Neutro Abs: 7 10*3/uL (ref 1.7–7.7)
Neutrophils Relative %: 62 %
Platelet Count: 226 10*3/uL (ref 150–400)
RBC: 5.67 MIL/uL — ABNORMAL HIGH (ref 3.87–5.11)
RDW: 13.8 % (ref 11.5–15.5)
WBC Count: 11.4 10*3/uL — ABNORMAL HIGH (ref 4.0–10.5)
nRBC: 0 % (ref 0.0–0.2)

## 2020-12-31 LAB — CMP (CANCER CENTER ONLY)
ALT: 28 U/L (ref 0–44)
AST: 19 U/L (ref 15–41)
Albumin: 4.1 g/dL (ref 3.5–5.0)
Alkaline Phosphatase: 64 U/L (ref 38–126)
Anion gap: 8 (ref 5–15)
BUN: 18 mg/dL (ref 8–23)
CO2: 28 mmol/L (ref 22–32)
Calcium: 10.8 mg/dL — ABNORMAL HIGH (ref 8.9–10.3)
Chloride: 97 mmol/L — ABNORMAL LOW (ref 98–111)
Creatinine: 0.82 mg/dL (ref 0.44–1.00)
GFR, Estimated: >60 mL/min (ref >60)
Glucose, Bld: 157 mg/dL — ABNORMAL HIGH (ref 70–99)
Potassium: 4.3 mmol/L (ref 3.5–5.1)
Sodium: 133 mmol/L — ABNORMAL LOW (ref 135–145)
Total Bilirubin: 0.3 mg/dL (ref 0.3–1.2)
Total Protein: 6.3 g/dL — ABNORMAL LOW (ref 6.5–8.1)

## 2020-12-31 NOTE — Progress Notes (Signed)
Hematology and Oncology Follow Up Visit  Jenny Schneider [id number] [birth year] 66 y.o. 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 She is doing quite well and has no complaints at this time.  She went to donate blood in April after finding out her JAK 2 testing and iron studies came back normal. Hgb is improved at 16.6, MCV 88, WBC count 11.4 and platelets 226.  She has cute back her smoking to 3/4 ppd.  She states that she snores quite a bit at night at may have sleep apnea.  She is getting over a bout with bronchitis and has a dry cough at this time. She is currently on Doxycycline 100 mg PO for a total of 10 days.  No fever, chills, n/v, rash, dizziness, chest pain, palpitations, abdominal pain or changes in bowel or bladder habits.  No swelling, tenderness, numbness or tingling in her extremities.  No falls or syncope to report.  She has maintained a good appetite and is staying well hydrated. Her weight is stable at 223 lbs.   ECOG Performance Status: 0 - Asymptomatic  Medications:  Allergies as of 12/31/2020       Reactions   Beta Adrenergic Blockers Other (See Comments)   Wheezing        Medication List        Accurate as of December 31, 2020  1:45 PM. If you have any questions, ask your nurse or doctor.          acetaminophen 500 MG tablet Commonly known as: TYLENOL You can take 1000 mg of Tylenol every 8 hours.  Use this as your first treatment for pain.  Continue as long as you need pain medicine.  You can buy this over the counter at any drug store.   You can alternate this with ibuprofen and Tramadol if you need a third pain medicine.   albuterol 108 (90 Base) MCG/ACT inhaler Commonly known as: VENTOLIN HFA Inhale into the lungs every 6 (six) hours as needed for wheezing or shortness of breath.   aspirin EC 81 MG tablet Take 81 mg by mouth  daily.   citalopram 10 MG tablet Commonly known as: CELEXA Take 10 mg by mouth daily.   dicyclomine 20 MG tablet Commonly known as: BENTYL Take 20 mg by mouth daily.   doxycycline 100 MG capsule Commonly known as: VIBRAMYCIN Take 100 mg by mouth 2 (two) times daily.   fexofenadine 180 MG tablet Commonly known as: ALLEGRA Take 180 mg by mouth daily.   fluconazole 150 MG tablet Commonly known as: DIFLUCAN Take 150 mg by mouth 3 (three) times a week.   glipiZIDE 5 MG tablet Commonly known as: GLUCOTROL Take 5 mg by mouth daily before breakfast.   hydrochlorothiazide 12.5 MG tablet Commonly known as: HYDRODIURIL Check your blood pressure on in AM before taking your blood pressure medicines, this is one.  Be sure BP is more than 100/60 before restarting this medicine.    Call your primary care with your blood pressure if you are not sure. Your normal dose is one daily.   ibuprofen 200 MG tablet Commonly known as: ADVIL Take 2-3 tablets (400-600 mg total) by mouth every 6 (six) hours as needed for moderate pain (Do not exceed 3 tablets every 6 hours, you can alternate this with Plain Tylenol).   lisinopril 20 MG tablet Commonly  known as: ZESTRIL Take 20 mg by mouth 2 (two) times daily.   rosuvastatin 10 MG tablet Commonly known as: Crestor Take 1 tablet (10 mg total) by mouth daily.   Trelegy Ellipta 200-62.5-25 MCG/INH Aepb Generic drug: Fluticasone-Umeclidin-Vilant        Allergies:  Allergies  Allergen Reactions   Beta Adrenergic Blockers Other (See Comments)    Wheezing     Past Medical History, Surgical history, Social history, and Family History were reviewed and updated.  Review of Systems: All other 10 point review of systems is negative.   Physical Exam:  weight is 223 lb (101.2 kg). Her oral temperature is 98.9 F (37.2 C). Her blood pressure is 127/71 and her pulse is 96. Her respiration is 18 and oxygen saturation is 97%.   Wt Readings from Last  3 Encounters:  12/31/20 223 lb (101.2 kg)  10/06/20 217 lb 1.9 oz (98.5 kg)  03/07/20 224 lb (101.6 kg)    Ocular: Sclerae unicteric, pupils equal, round and reactive to light Ear-nose-throat: Oropharynx clear, dentition fair Lymphatic: No cervical or supraclavicular adenopathy Lungs no rales or rhonchi, good excursion bilaterally Heart regular rate and rhythm, no murmur appreciated Abd soft, nontender, positive bowel sounds MSK no focal spinal tenderness, no joint edema Neuro: non-focal, well-oriented, appropriate affect Breasts: Deferred   Lab Results  Component Value Date   WBC 11.4 (H) 12/31/2020   HGB 16.6 (H) 12/31/2020   HCT 50.3 (H) 12/31/2020   MCV 88.7 12/31/2020   PLT 226 12/31/2020   Lab Results  Component Value Date   FERRITIN 116 10/06/2020   IRON 62 10/06/2020   TIBC 378 10/06/2020   UIBC 315 10/06/2020   IRONPCTSAT 17 (L) 10/06/2020   Lab Results  Component Value Date   RETICCTPCT 1.6 10/06/2020   RBC 5.67 (H) 12/31/2020   No results found for: KPAFRELGTCHN, LAMBDASER, KAPLAMBRATIO No results found for: IGGSERUM, IGA, IGMSERUM No results found for: Ronnald Ramp, A1GS, A2GS, Tillman Sers, SPEI   Chemistry      Component Value Date/Time   NA 138 10/06/2020 1303   K 4.4 10/06/2020 1303   CL 98 10/06/2020 1303   CO2 32 10/06/2020 1303   BUN 17 10/06/2020 1303   CREATININE 0.93 10/06/2020 1303      Component Value Date/Time   CALCIUM 10.5 (H) 10/06/2020 1303   ALKPHOS 70 10/06/2020 1303   AST 20 10/06/2020 1303   ALT 27 10/06/2020 1303   BILITOT 0.3 10/06/2020 1303       Impression and Plan: Jenny Schneider is a very pleasant 65 yo caucasian female with 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 JAK 2 and iron studies were negative.  She will continue to cut back on her smoking and go [facility] every 3-4 months.  She will also follow-up with her PCP regarding testing for sleep apnea.   Follow-up in 4 months.  She can contact our office with any questions or concerns.   Laverna Peace, NP 6/15/20221:45 PM

## 2020-12-31 NOTE — Telephone Encounter (Signed)
Per 12/31/20 los - gave patient upcoming appointments

## 2020-12-31 NOTE — Telephone Encounter (Signed)
Per 12/31/20 los - gave patient upcoming appointment

## 2021-01-08 ENCOUNTER — Telehealth: Payer: Self-pay | Admitting: *Deleted

## 2021-01-08 NOTE — Telephone Encounter (Signed)
Called and lvm about rescheduling appointment from 05/01/21 to 05/04/21 with Judson Roch. Request call back to confirm - mailed new calendar

## 2021-04-24 ENCOUNTER — Telehealth: Payer: Self-pay | Admitting: *Deleted

## 2021-04-24 NOTE — Telephone Encounter (Signed)
Called and lvm of moving appointment down from 1:00 to 2:15 - requested callback to confirm

## 2021-05-01 ENCOUNTER — Other Ambulatory Visit: Payer: BC Managed Care – PPO

## 2021-05-01 ENCOUNTER — Ambulatory Visit: Payer: BC Managed Care – PPO | Admitting: Family

## 2021-05-04 ENCOUNTER — Ambulatory Visit: Payer: BC Managed Care – PPO | Admitting: Family

## 2021-05-04 ENCOUNTER — Other Ambulatory Visit: Payer: BC Managed Care – PPO

## 2021-05-04 ENCOUNTER — Inpatient Hospital Stay (HOSPITAL_BASED_OUTPATIENT_CLINIC_OR_DEPARTMENT_OTHER): Payer: BC Managed Care – PPO | Admitting: Family

## 2021-05-04 ENCOUNTER — Other Ambulatory Visit: Payer: Self-pay

## 2021-05-04 ENCOUNTER — Encounter: Payer: Self-pay | Admitting: Family

## 2021-05-04 ENCOUNTER — Inpatient Hospital Stay: Payer: BC Managed Care – PPO | Attending: Hematology & Oncology

## 2021-05-04 VITALS — BP 146/69 | HR 75 | Temp 98.0°F | Resp 19 | Ht 66.0 in | Wt 223.0 lb

## 2021-05-04 DIAGNOSIS — J449 Chronic obstructive pulmonary disease, unspecified: Secondary | ICD-10-CM | POA: Diagnosis not present

## 2021-05-04 DIAGNOSIS — R202 Paresthesia of skin: Secondary | ICD-10-CM | POA: Diagnosis not present

## 2021-05-04 DIAGNOSIS — D751 Secondary polycythemia: Secondary | ICD-10-CM

## 2021-05-04 DIAGNOSIS — D508 Other iron deficiency anemias: Secondary | ICD-10-CM

## 2021-05-04 DIAGNOSIS — Z7982 Long term (current) use of aspirin: Secondary | ICD-10-CM | POA: Insufficient documentation

## 2021-05-04 DIAGNOSIS — Z79899 Other long term (current) drug therapy: Secondary | ICD-10-CM | POA: Insufficient documentation

## 2021-05-04 DIAGNOSIS — R200 Anesthesia of skin: Secondary | ICD-10-CM | POA: Insufficient documentation

## 2021-05-04 LAB — CMP (CANCER CENTER ONLY)
ALT: 26 U/L (ref 0–44)
AST: 20 U/L (ref 15–41)
Albumin: 4.1 g/dL (ref 3.5–5.0)
Alkaline Phosphatase: 67 U/L (ref 38–126)
Anion gap: 6 (ref 5–15)
BUN: 19 mg/dL (ref 8–23)
CO2: 32 mmol/L (ref 22–32)
Calcium: 10.3 mg/dL (ref 8.9–10.3)
Chloride: 99 mmol/L (ref 98–111)
Creatinine: 0.95 mg/dL (ref 0.44–1.00)
GFR, Estimated: >60 mL/min (ref >60)
Glucose, Bld: 208 mg/dL — ABNORMAL HIGH (ref 70–99)
Potassium: 4.4 mmol/L (ref 3.5–5.1)
Sodium: 137 mmol/L (ref 135–145)
Total Bilirubin: 0.3 mg/dL (ref 0.3–1.2)
Total Protein: 6.9 g/dL (ref 6.5–8.1)

## 2021-05-04 LAB — CBC WITH DIFFERENTIAL (CANCER CENTER ONLY)
Abs Immature Granulocytes: 0.04 10*3/uL (ref 0.00–0.07)
Basophils Absolute: 0.1 10*3/uL (ref 0.0–0.1)
Basophils Relative: 1 %
Eosinophils Absolute: 0.3 10*3/uL (ref 0.0–0.5)
Eosinophils Relative: 3 %
HCT: 49.7 % — ABNORMAL HIGH (ref 36.0–46.0)
Hemoglobin: 16.6 g/dL — ABNORMAL HIGH (ref 12.0–15.0)
Immature Granulocytes: 0 %
Lymphocytes Relative: 33 %
Lymphs Abs: 3.3 10*3/uL (ref 0.7–4.0)
MCH: 30.2 pg (ref 26.0–34.0)
MCHC: 33.4 g/dL (ref 30.0–36.0)
MCV: 90.4 fL (ref 80.0–100.0)
Monocytes Absolute: 0.7 10*3/uL (ref 0.1–1.0)
Monocytes Relative: 7 %
Neutro Abs: 5.7 10*3/uL (ref 1.7–7.7)
Neutrophils Relative %: 56 %
Platelet Count: 223 10*3/uL (ref 150–400)
RBC: 5.5 MIL/uL — ABNORMAL HIGH (ref 3.87–5.11)
RDW: 13.4 % (ref 11.5–15.5)
WBC Count: 10.1 10*3/uL (ref 4.0–10.5)
nRBC: 0 % (ref 0.0–0.2)

## 2021-05-04 NOTE — Progress Notes (Signed)
Hematology and Oncology Follow Up Visit  Jenny Schneider [id number] [birth year] 65 y.o. 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 Jenny Schneider is here today for follow-up. She is doing fairly well but notes fatigue and generalized joint aches and pains.  She had gone to [facility] but was denied due to her history of skin cancer. If needed we will phlebotomize her here at the [place].  She has SOB with exertion due to COPD. This is unchanged from baseline. She takes a break to rest when needed.  She has cut back to < 1/2 ppd and we are so very proud of her.  No fever, chills, n/v, cough, rash, dizziness, chest pain, palpitations, abdominal pain or changes in bowel or bladder habits.  She has numbness and tingling in her fingertips that comes and goes.  No falls or syncope.  She has a good appetite and is staying well hydrated. Her weight is stable at 223 lbs.   ECOG Performance Status: 1 - Symptomatic but completely ambulatory  Medications:  Allergies as of 05/04/2021       Reactions   Beta Adrenergic Blockers Other (See Comments)   Wheezing        Medication List        Accurate as of May 04, 2021  3:11 PM. If you have any questions, ask your nurse or doctor.          STOP taking these medications    doxycycline 100 MG capsule Commonly known as: VIBRAMYCIN Stopped by: Lottie Dawson, NP       TAKE these medications    acetaminophen 500 MG tablet Commonly known as: TYLENOL You can take 1000 mg of Tylenol every 8 hours.  Use this as your first treatment for pain.  Continue as long as you need pain medicine.  You can buy this over the counter at any drug store.   You can alternate this with ibuprofen and Tramadol if you need a third pain medicine.   albuterol 108 (90 Base) MCG/ACT inhaler Commonly known as: VENTOLIN  HFA Inhale into the lungs every 6 (six) hours as needed for wheezing or shortness of breath.   aspirin EC 81 MG tablet Take 81 mg by mouth daily.   citalopram 10 MG tablet Commonly known as: CELEXA Take 10 mg by mouth daily.   dicyclomine 20 MG tablet Commonly known as: BENTYL Take 20 mg by mouth daily.   fexofenadine 180 MG tablet Commonly known as: ALLEGRA Take 180 mg by mouth daily.   fluconazole 150 MG tablet Commonly known as: DIFLUCAN Take 150 mg by mouth 3 (three) times a week.   glipiZIDE 5 MG tablet Commonly known as: GLUCOTROL Take 5 mg by mouth daily before breakfast.   hydrochlorothiazide 12.5 MG tablet Commonly known as: HYDRODIURIL Check your blood pressure on in AM before taking your blood pressure medicines, this is one.  Be sure BP is more than 100/60 before restarting this medicine.    Call your primary care with your blood pressure if you are not sure. Your normal dose is one daily.   ibuprofen 200 MG tablet Commonly known as: ADVIL Take 2-3 tablets (400-600 mg total) by mouth every 6 (six) hours as needed for moderate pain (  Do not exceed 3 tablets every 6 hours, you can alternate this with Plain Tylenol).   lisinopril 20 MG tablet Commonly known as: ZESTRIL Take 20 mg by mouth 2 (two) times daily.   rosuvastatin 10 MG tablet Commonly known as: Crestor Take 1 tablet (10 mg total) by mouth daily.   Trelegy Ellipta 200-62.5-25 MCG/INH Aepb Generic drug: Fluticasone-Umeclidin-Vilant        Allergies:  Allergies  Allergen Reactions   Beta Adrenergic Blockers Other (See Comments)    Wheezing     Past Medical History, Surgical history, Social history, and Family History were reviewed and updated.  Review of Systems: All other 10 point review of systems is negative.   Physical Exam:  height is 5\' 6"  (1.676 m) and weight is 223 lb (101.2 kg). Her oral temperature is 98 F (36.7 C). Her blood pressure is 146/69 (abnormal) and her pulse is 75.  Her respiration is 19 and oxygen saturation is 99%.   Wt Readings from Last 3 Encounters:  05/04/21 223 lb (101.2 kg)  12/31/20 223 lb (101.2 kg)  10/06/20 217 lb 1.9 oz (98.5 kg)    Ocular: Sclerae unicteric, pupils equal, round and reactive to light Ear-nose-throat: Oropharynx clear, dentition fair Lymphatic: No cervical or supraclavicular adenopathy Lungs no rales or rhonchi, good excursion bilaterally Heart regular rate and rhythm, no murmur appreciated Abd soft, nontender, positive bowel sounds MSK no focal spinal tenderness, no joint edema Neuro: non-focal, well-oriented, appropriate affect Breasts: Deferred   Lab Results  Component Value Date   WBC 10.1 05/04/2021   HGB 16.6 (H) 05/04/2021   HCT 49.7 (H) 05/04/2021   MCV 90.4 05/04/2021   PLT 223 05/04/2021   Lab Results  Component Value Date   FERRITIN 116 10/06/2020   IRON 62 10/06/2020   TIBC 378 10/06/2020   UIBC 315 10/06/2020   IRONPCTSAT 17 (L) 10/06/2020   Lab Results  Component Value Date   RETICCTPCT 1.6 10/06/2020   RBC 5.50 (H) 05/04/2021   No results found for: KPAFRELGTCHN, LAMBDASER, KAPLAMBRATIO No results found for: IGGSERUM, IGA, IGMSERUM No results found for: Ronnald Ramp, A1GS, A2GS, Tillman Sers, SPEI   Chemistry      Component Value Date/Time   NA 137 05/04/2021 1357   K 4.4 05/04/2021 1357   CL 99 05/04/2021 1357   CO2 32 05/04/2021 1357   BUN 19 05/04/2021 1357   CREATININE 0.95 05/04/2021 1357      Component Value Date/Time   CALCIUM 10.3 05/04/2021 1357   ALKPHOS 67 05/04/2021 1357   AST 20 05/04/2021 1357   ALT 26 05/04/2021 1357   BILITOT 0.3 05/04/2021 1357       Impression and Plan: Ms. 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   Hgb is stable at 16.6, Hct 49.7%. Iron studies are pending.  No phlebotomy at this time. Iron saturation has been low.  We will avoid making her hypoxic.  We do still recommend she follow-up with her PCP regarding sleep study.  We will plan to see her again in 4 months.  She can contact our office with any questions or concerns.   Lottie Dawson, NP 10/17/20223:11 PM

## 2021-05-05 LAB — FERRITIN: Ferritin: 98 ng/mL (ref 11–307)

## 2021-05-05 LAB — IRON AND TIBC
Iron: 60 ug/dL (ref 41–142)
Saturation Ratios: 18 % — ABNORMAL LOW (ref 21–57)
TIBC: 343 ug/dL (ref 236–444)
UIBC: 282 ug/dL (ref 120–384)

## 2021-08-14 ENCOUNTER — Telehealth: Payer: Self-pay | Admitting: *Deleted

## 2021-08-14 NOTE — Telephone Encounter (Signed)
Called and lvm of rescheduling appt due to Judson Roch being out from 09/07/21 to 09/08/21 requested call back to confirm

## 2021-09-07 ENCOUNTER — Ambulatory Visit: Payer: BC Managed Care – PPO | Admitting: Family

## 2021-09-07 ENCOUNTER — Other Ambulatory Visit: Payer: BC Managed Care – PPO

## 2021-09-08 ENCOUNTER — Other Ambulatory Visit: Payer: Self-pay

## 2021-09-08 ENCOUNTER — Inpatient Hospital Stay: Payer: BC Managed Care – PPO | Attending: Hematology & Oncology

## 2021-09-08 ENCOUNTER — Encounter: Payer: Self-pay | Admitting: Family

## 2021-09-08 ENCOUNTER — Inpatient Hospital Stay: Payer: BC Managed Care – PPO

## 2021-09-08 ENCOUNTER — Inpatient Hospital Stay: Payer: BC Managed Care – PPO | Admitting: Family

## 2021-09-08 VITALS — BP 146/59 | HR 82 | Temp 98.0°F | Resp 18 | Ht 66.0 in | Wt 216.1 lb

## 2021-09-08 DIAGNOSIS — D508 Other iron deficiency anemias: Secondary | ICD-10-CM | POA: Diagnosis not present

## 2021-09-08 DIAGNOSIS — F17210 Nicotine dependence, cigarettes, uncomplicated: Secondary | ICD-10-CM | POA: Insufficient documentation

## 2021-09-08 DIAGNOSIS — J449 Chronic obstructive pulmonary disease, unspecified: Secondary | ICD-10-CM | POA: Diagnosis not present

## 2021-09-08 DIAGNOSIS — D751 Secondary polycythemia: Secondary | ICD-10-CM

## 2021-09-08 LAB — IRON AND IRON BINDING CAPACITY (CC-WL,HP ONLY)
Iron: 93 ug/dL (ref 28–170)
Saturation Ratios: 22 % (ref 10.4–31.8)
TIBC: 414 ug/dL (ref 250–450)
UIBC: 321 ug/dL (ref 148–442)

## 2021-09-08 LAB — CBC WITH DIFFERENTIAL (CANCER CENTER ONLY)
Abs Immature Granulocytes: 0.03 10*3/uL (ref 0.00–0.07)
Basophils Absolute: 0.1 10*3/uL (ref 0.0–0.1)
Basophils Relative: 1 %
Eosinophils Absolute: 0.3 10*3/uL (ref 0.0–0.5)
Eosinophils Relative: 3 %
HCT: 54.3 % — ABNORMAL HIGH (ref 36.0–46.0)
Hemoglobin: 17.7 g/dL — ABNORMAL HIGH (ref 12.0–15.0)
Immature Granulocytes: 0 %
Lymphocytes Relative: 34 %
Lymphs Abs: 3.4 10*3/uL (ref 0.7–4.0)
MCH: 29.4 pg (ref 26.0–34.0)
MCHC: 32.6 g/dL (ref 30.0–36.0)
MCV: 90 fL (ref 80.0–100.0)
Monocytes Absolute: 0.8 10*3/uL (ref 0.1–1.0)
Monocytes Relative: 8 %
Neutro Abs: 5.5 10*3/uL (ref 1.7–7.7)
Neutrophils Relative %: 54 %
Platelet Count: 233 10*3/uL (ref 150–400)
RBC: 6.03 MIL/uL — ABNORMAL HIGH (ref 3.87–5.11)
RDW: 13.2 % (ref 11.5–15.5)
WBC Count: 10.1 10*3/uL (ref 4.0–10.5)
nRBC: 0 % (ref 0.0–0.2)

## 2021-09-08 LAB — CMP (CANCER CENTER ONLY)
ALT: 31 U/L (ref 0–44)
AST: 23 U/L (ref 15–41)
Albumin: 4.1 g/dL (ref 3.5–5.0)
Alkaline Phosphatase: 70 U/L (ref 38–126)
Anion gap: 7 (ref 5–15)
BUN: 17 mg/dL (ref 8–23)
CO2: 31 mmol/L (ref 22–32)
Calcium: 9.7 mg/dL (ref 8.9–10.3)
Chloride: 100 mmol/L (ref 98–111)
Creatinine: 0.95 mg/dL (ref 0.44–1.00)
GFR, Estimated: >60 mL/min (ref >60)
Glucose, Bld: 144 mg/dL — ABNORMAL HIGH (ref 70–99)
Potassium: 4.2 mmol/L (ref 3.5–5.1)
Sodium: 138 mmol/L (ref 135–145)
Total Bilirubin: 0.4 mg/dL (ref 0.3–1.2)
Total Protein: 7.2 g/dL (ref 6.5–8.1)

## 2021-09-08 NOTE — Progress Notes (Signed)
Hematology and Oncology Follow Up Visit  Jenny Schneider [id number] July 01, 1956 66 y.o. 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 She is doing well but does note persistent fatigue which she states is unchanged from baseline.  She is smoking 1/2 ppd.  She has mild SOB with exertion due to asthma.  She is taking her baby aspirin daily.  Hgb is 17.7, Hct 54%.  No fever, chills, n/v, cough, rash, dizziness, chest pain, palpitations, abdominal pain or changes in bowel or bladder habits.  No blood loss noted. No bruising or petechiae.  No swelling, tenderness, numbness or tingling in her extremities at this time.  No falls or syncope to report.  She has a good appetite and is staying well hydrated throughout the day.  Her weight is 216 lbs.   ECOG Performance Status: 1 - Symptomatic but completely ambulatory  Medications:  Allergies as of 09/08/2021       Reactions   Beta Adrenergic Blockers Other (See Comments)   Wheezing        Medication List        Accurate as of September 08, 2021 11:46 AM. If you have any questions, ask your nurse or doctor.          STOP taking these medications    fluconazole 150 MG tablet Commonly known as: DIFLUCAN Stopped by: Lottie Dawson, NP       TAKE these medications    acetaminophen 500 MG tablet Commonly known as: TYLENOL You can take 1000 mg of Tylenol every 8 hours.  Use this as your first treatment for pain.  Continue as long as you need pain medicine.  You can buy this over the counter at any drug store.   You can alternate this with ibuprofen and Tramadol if you need a third pain medicine.   albuterol 108 (90 Base) MCG/ACT inhaler Commonly known as: VENTOLIN HFA Inhale into the lungs every 6 (six) hours as needed for wheezing or shortness of breath.   aspirin EC 81 MG  tablet Take 81 mg by mouth daily.   citalopram 10 MG tablet Commonly known as: CELEXA Take 10 mg by mouth daily.   dicyclomine 20 MG tablet Commonly known as: BENTYL Take 20 mg by mouth daily.   fexofenadine 180 MG tablet Commonly known as: ALLEGRA Take 180 mg by mouth daily.   glipiZIDE 5 MG tablet Commonly known as: GLUCOTROL Take 5 mg by mouth daily before breakfast.   hydrochlorothiazide 12.5 MG tablet Commonly known as: HYDRODIURIL Check your blood pressure on in AM before taking your blood pressure medicines, this is one.  Be sure BP is more than 100/60 before restarting this medicine.    Call your primary care with your blood pressure if you are not sure. Your normal dose is one daily.   ibuprofen 200 MG tablet Commonly known as: ADVIL Take 2-3 tablets (400-600 mg total) by mouth every 6 (six) hours as needed for moderate pain (Do not exceed 3 tablets every 6 hours, you can alternate this with Plain Tylenol).   lisinopril 20 MG tablet Commonly known as: ZESTRIL Take 20 mg by mouth 2 (two) times daily.   rosuvastatin 10 MG tablet Commonly known as: Crestor Take 1 tablet (10 mg total) by mouth daily.   Trelegy Ellipta 200-62.5-25 MCG/ACT  Aepb Generic drug: Fluticasone-Umeclidin-Vilant        Allergies:  Allergies  Allergen Reactions   Beta Adrenergic Blockers Other (See Comments)    Wheezing     Past Medical History, Surgical history, Social history, and Family History were reviewed and updated.  Review of Systems: All other 10 point review of systems is negative.   Physical Exam:  height is 5\' 6"  (1.676 m) and weight is 216 lb 1.9 oz (98 kg). Her oral temperature is 98 F (36.7 C). Her blood pressure is 146/59 (abnormal) and her pulse is 82. Her respiration is 18 and oxygen saturation is 100%.   Wt Readings from Last 3 Encounters:  09/08/21 216 lb 1.9 oz (98 kg)  05/04/21 223 lb (101.2 kg)  12/31/20 223 lb (101.2 kg)    Ocular: Sclerae unicteric,  pupils equal, round and reactive to light Ear-nose-throat: Oropharynx clear, dentition fair Lymphatic: No cervical or supraclavicular adenopathy Lungs no rales or rhonchi, good excursion bilaterally Heart regular rate and rhythm, no murmur appreciated Abd soft, nontender, positive bowel sounds MSK no focal spinal tenderness, no joint edema Neuro: non-focal, well-oriented, appropriate affect Breasts: Deferred   Lab Results  Component Value Date   WBC 10.1 09/08/2021   HGB 17.7 (H) 09/08/2021   HCT 54.3 (H) 09/08/2021   MCV 90.0 09/08/2021   PLT 233 09/08/2021   Lab Results  Component Value Date   FERRITIN 98 05/04/2021   IRON 60 05/04/2021   TIBC 343 05/04/2021   UIBC 282 05/04/2021   IRONPCTSAT 18 (L) 05/04/2021   Lab Results  Component Value Date   RETICCTPCT 1.6 10/06/2020   RBC 6.03 (H) 09/08/2021   No results found for: KPAFRELGTCHN, LAMBDASER, KAPLAMBRATIO No results found for: IGGSERUM, IGA, IGMSERUM No results found for: Ronnald Ramp, A1GS, A2GS, Tillman Sers, SPEI   Chemistry      Component Value Date/Time   NA 137 05/04/2021 1357   K 4.4 05/04/2021 1357   CL 99 05/04/2021 1357   CO2 32 05/04/2021 1357   BUN 19 05/04/2021 1357   CREATININE 0.95 05/04/2021 1357      Component Value Date/Time   CALCIUM 10.3 05/04/2021 1357   ALKPHOS 67 05/04/2021 1357   AST 20 05/04/2021 1357   ALT 26 05/04/2021 1357   BILITOT 0.3 05/04/2021 1357       Impression and Plan: Jenny Schneider is a very pleasant 66 yo caucasian female with COPD and history of intermittent erythrocytosis since at least 2019, JAK 2 negative.   No phlebotomy needed at this time.  Iron studies are pending.  We do recommend she move forward with testing for sleep apnea. She plans to discuss at her PCP follow-up in 3 weeks.   Follow-up in 6 months.   Lottie Dawson, NP 2/21/202311:46 AM

## 2021-09-09 LAB — FERRITIN: Ferritin: 99 ng/mL (ref 11–307)

## 2021-09-14 ENCOUNTER — Telehealth: Payer: Self-pay | Admitting: *Deleted

## 2021-09-14 NOTE — Telephone Encounter (Signed)
Per entered late Jenny Schneider 09/08/21 los - called and lvm of upcoming appointments -requested call back to confirm - mailed calendar

## 2022-01-11 IMAGING — DX DG THORACIC SPINE 3V
3 series · 3 of 3 positions shown · non-contrast
Comparison: None.

CLINICAL DATA: Upper and lower back pain times several weeks.

EXAM:
THORACIC SPINE - 3 VIEWS

[t-spine ap]
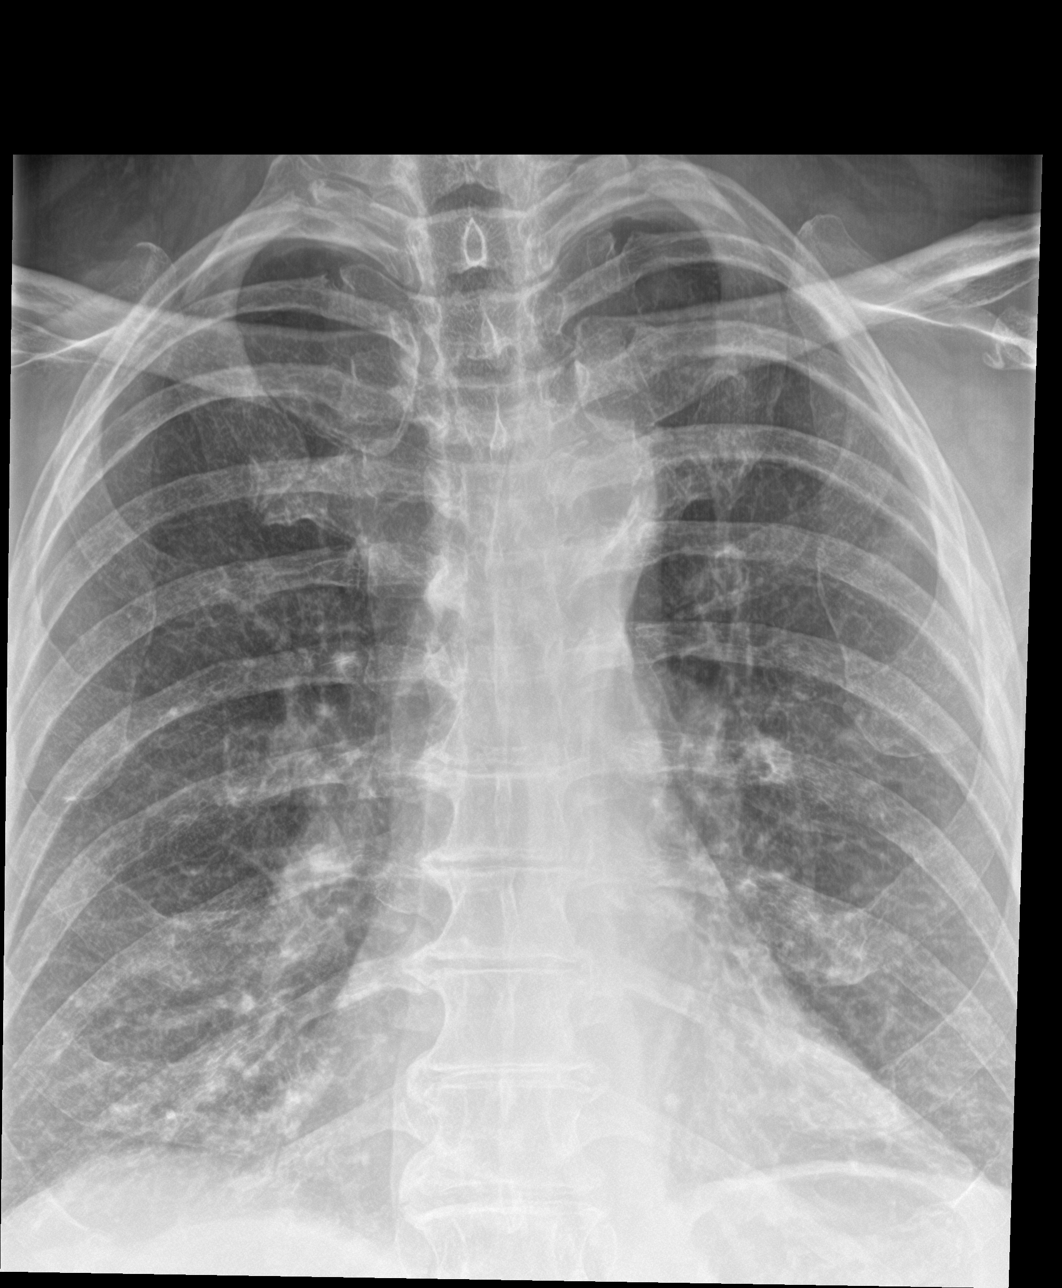

[t-spine lat]
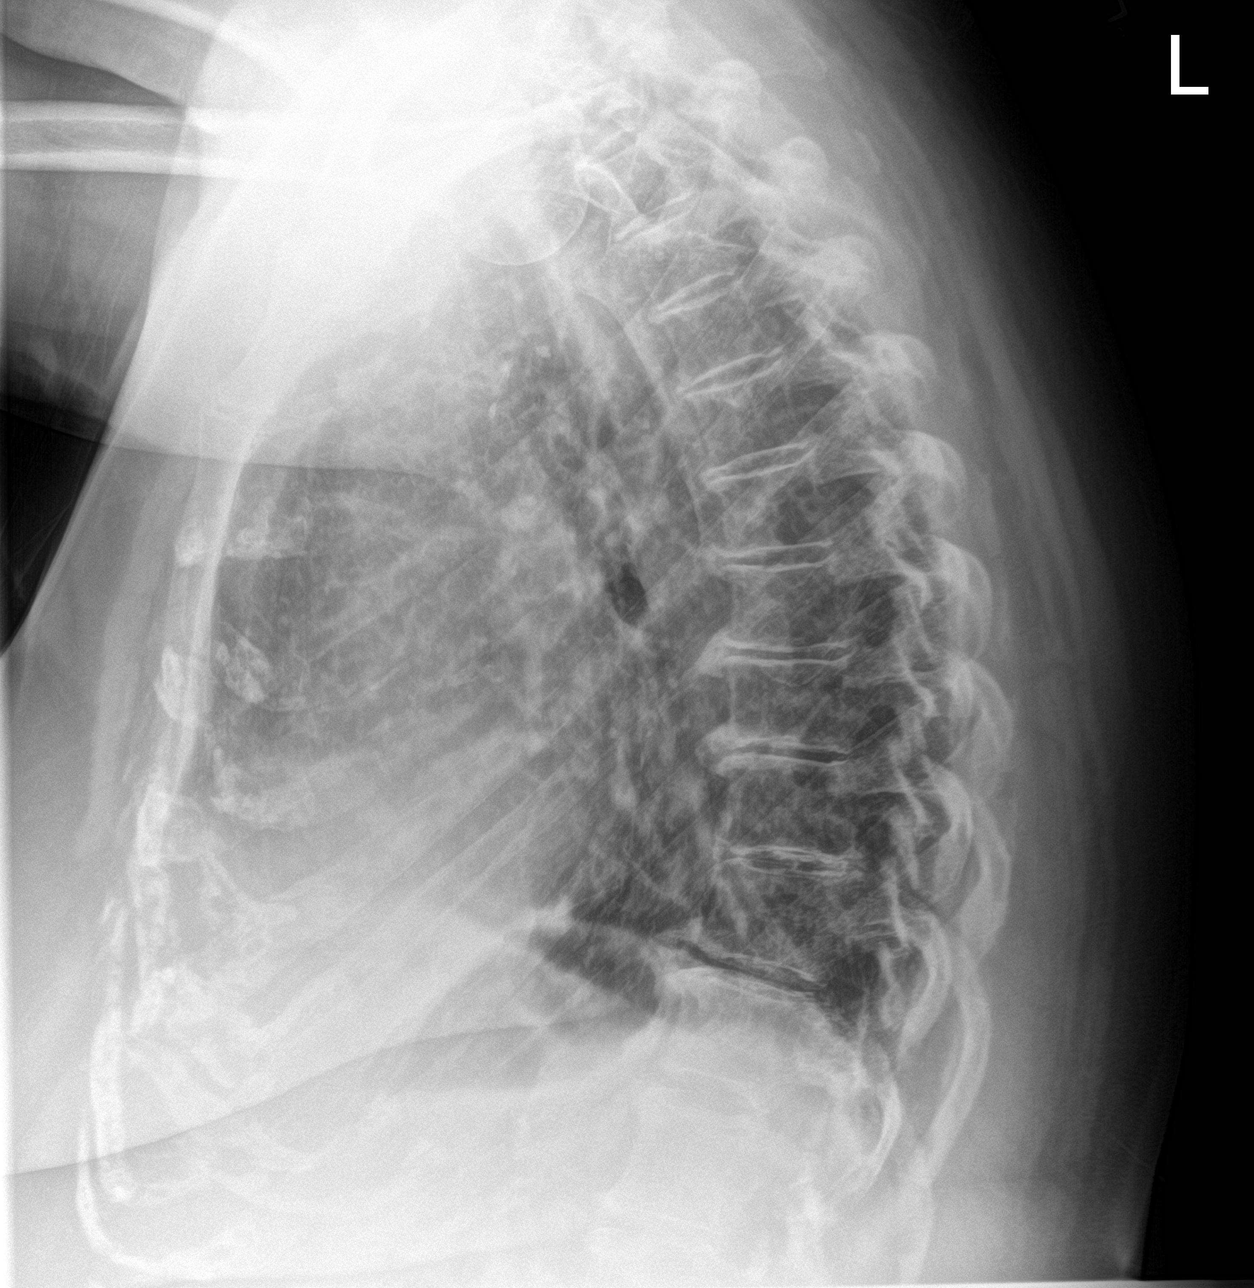

[t-spine swimmers]
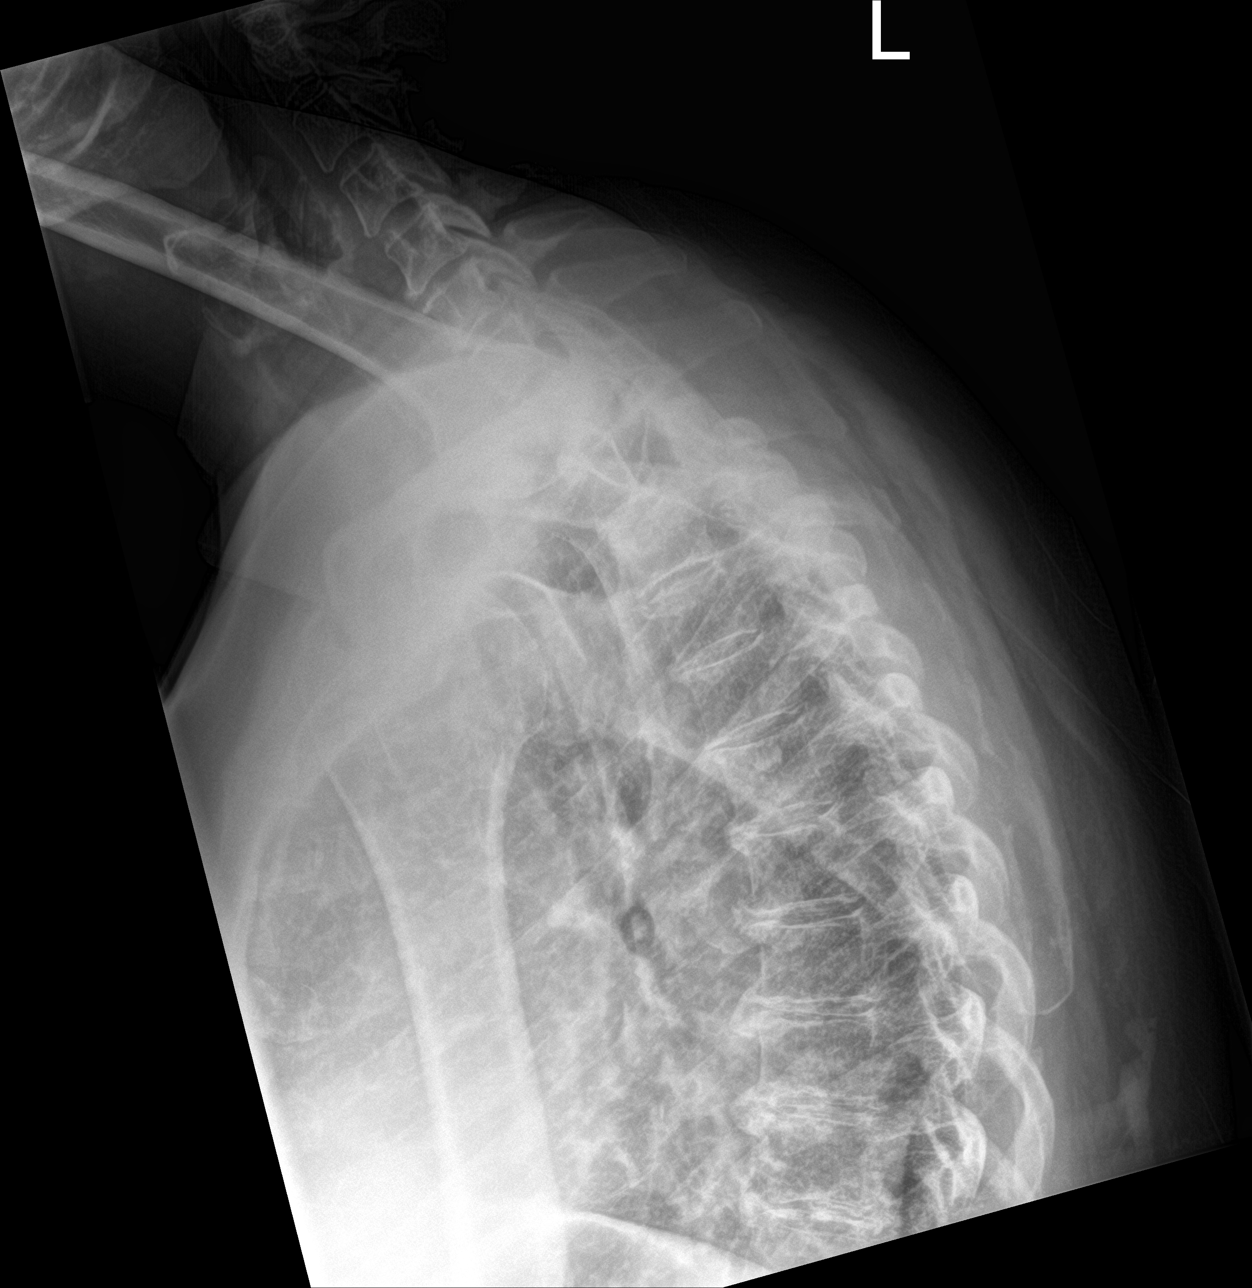

[3 of 3 positions shown; findings below may reference images not displayed]

FINDINGS: There is no evidence of thoracic spine fracture. Alignment is
normal. Moderate severity multilevel endplate sclerosis is seen.
Mild-to-moderate severity multilevel intervertebral disc space
narrowing is also noted throughout the thoracic spine. No other
significant bone abnormalities are identified.
IMPRESSION: Moderate severity multilevel degenerative disc disease in the
thoracic spine.

## 2022-03-08 ENCOUNTER — Inpatient Hospital Stay: Payer: BC Managed Care – PPO | Admitting: Family

## 2022-03-08 ENCOUNTER — Inpatient Hospital Stay: Payer: BC Managed Care – PPO | Attending: Hematology & Oncology

## 2022-10-29 ENCOUNTER — Inpatient Hospital Stay: Payer: BC Managed Care – PPO | Attending: Hematology & Oncology

## 2022-10-29 ENCOUNTER — Other Ambulatory Visit: Payer: Self-pay | Admitting: Family

## 2022-10-29 ENCOUNTER — Inpatient Hospital Stay: Payer: BC Managed Care – PPO | Admitting: Family

## 2022-10-29 ENCOUNTER — Other Ambulatory Visit: Payer: Self-pay

## 2022-10-29 ENCOUNTER — Encounter: Payer: Self-pay | Admitting: Family

## 2022-10-29 ENCOUNTER — Inpatient Hospital Stay: Payer: BC Managed Care – PPO

## 2022-10-29 VITALS — BP 136/75 | HR 88 | Temp 98.1°F | Resp 18 | Ht 66.14 in | Wt 201.8 lb

## 2022-10-29 DIAGNOSIS — Z7982 Long term (current) use of aspirin: Secondary | ICD-10-CM | POA: Diagnosis not present

## 2022-10-29 DIAGNOSIS — F17210 Nicotine dependence, cigarettes, uncomplicated: Secondary | ICD-10-CM | POA: Insufficient documentation

## 2022-10-29 DIAGNOSIS — D508 Other iron deficiency anemias: Secondary | ICD-10-CM | POA: Diagnosis not present

## 2022-10-29 DIAGNOSIS — D751 Secondary polycythemia: Secondary | ICD-10-CM

## 2022-10-29 DIAGNOSIS — J449 Chronic obstructive pulmonary disease, unspecified: Secondary | ICD-10-CM | POA: Insufficient documentation

## 2022-10-29 LAB — CBC WITH DIFFERENTIAL (CANCER CENTER ONLY)
Abs Immature Granulocytes: 0.04 10*3/uL (ref 0.00–0.07)
Basophils Absolute: 0.1 10*3/uL (ref 0.0–0.1)
Basophils Relative: 1 %
Eosinophils Absolute: 0.3 10*3/uL (ref 0.0–0.5)
Eosinophils Relative: 3 %
HCT: 53.6 % — ABNORMAL HIGH (ref 36.0–46.0)
Hemoglobin: 17.7 g/dL — ABNORMAL HIGH (ref 12.0–15.0)
Immature Granulocytes: 0 %
Lymphocytes Relative: 31 %
Lymphs Abs: 3 10*3/uL (ref 0.7–4.0)
MCH: 28.9 pg (ref 26.0–34.0)
MCHC: 33 g/dL (ref 30.0–36.0)
MCV: 87.4 fL (ref 80.0–100.0)
Monocytes Absolute: 0.9 10*3/uL (ref 0.1–1.0)
Monocytes Relative: 9 %
Neutro Abs: 5.5 10*3/uL (ref 1.7–7.7)
Neutrophils Relative %: 56 %
Platelet Count: 229 10*3/uL (ref 150–400)
RBC: 6.13 MIL/uL — ABNORMAL HIGH (ref 3.87–5.11)
RDW: 13.3 % (ref 11.5–15.5)
WBC Count: 9.8 10*3/uL (ref 4.0–10.5)
nRBC: 0 % (ref 0.0–0.2)

## 2022-10-29 LAB — CMP (CANCER CENTER ONLY)
ALT: 27 U/L (ref 0–44)
AST: 21 U/L (ref 15–41)
Albumin: 4.2 g/dL (ref 3.5–5.0)
Alkaline Phosphatase: 81 U/L (ref 38–126)
Anion gap: 8 (ref 5–15)
BUN: 17 mg/dL (ref 8–23)
CO2: 30 mmol/L (ref 22–32)
Calcium: 10.3 mg/dL (ref 8.9–10.3)
Chloride: 97 mmol/L — ABNORMAL LOW (ref 98–111)
Creatinine: 0.93 mg/dL (ref 0.44–1.00)
GFR, Estimated: >60 mL/min (ref >60)
Glucose, Bld: 139 mg/dL — ABNORMAL HIGH (ref 70–99)
Potassium: 4.5 mmol/L (ref 3.5–5.1)
Sodium: 135 mmol/L (ref 135–145)
Total Bilirubin: 0.3 mg/dL (ref 0.3–1.2)
Total Protein: 7.4 g/dL (ref 6.5–8.1)

## 2022-10-29 LAB — IRON AND IRON BINDING CAPACITY (CC-WL,HP ONLY)
Iron: 63 ug/dL (ref 28–170)
Saturation Ratios: 16 % (ref 10.4–31.8)
TIBC: 384 ug/dL (ref 250–450)
UIBC: 321 ug/dL

## 2022-10-29 LAB — FERRITIN: Ferritin: 67 ng/mL (ref 11–307)

## 2022-10-29 NOTE — Progress Notes (Signed)
Hematology and Oncology Follow Up Visit  Jenny Schneider [id number] 11/11/1955 68 y.o. 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 She is having neuropathic pain in her hands and would like to see if phlebotomy would help with this.  She states that her blood glucose is well controlled.  She is taking her baby aspirin. No fever, chills, n/v, cough, rash, dizziness, chest pain, palpitations, abdominal pain or changes in bowel or bladder habits.  SOB is stable and she is till smoking 1 ppd.  No swelling in her extremities.  No falls or syncope reported. She is ambulating with a Rolator for added support.  Appetite and hydration are good. Weight is 201 lbs.   ECOG Performance Status: 1 - Symptomatic but completely ambulatory  Medications:  Allergies as of 10/29/2022       Reactions   Beta Adrenergic Blockers Other (See Comments)   Wheezing        Medication List        Accurate as of October 29, 2022  2:43 PM. If you have any questions, ask your nurse or doctor.          acetaminophen 500 MG tablet Commonly known as: TYLENOL You can take 1000 mg of Tylenol every 8 hours.  Use this as your first treatment for pain.  Continue as long as you need pain medicine.  You can buy this over the counter at any drug store.   You can alternate this with ibuprofen and Tramadol if you need a third pain medicine.   albuterol 108 (90 Base) MCG/ACT inhaler Commonly known as: VENTOLIN HFA Inhale into the lungs every 6 (six) hours as needed for wheezing or shortness of breath.   aspirin EC 81 MG tablet Take 81 mg by mouth daily.   citalopram 10 MG tablet Commonly known as: CELEXA Take 10 mg by mouth daily.   dicyclomine 20 MG tablet Commonly known as: BENTYL Take 20 mg by mouth daily.   fexofenadine 180 MG tablet Commonly known as:  ALLEGRA Take 180 mg by mouth daily.   glipiZIDE 5 MG tablet Commonly known as: GLUCOTROL Take 5 mg by mouth daily before breakfast.   hydrochlorothiazide 12.5 MG tablet Commonly known as: HYDRODIURIL Check your blood pressure on in AM before taking your blood pressure medicines, this is one.  Be sure BP is more than 100/60 before restarting this medicine.    Call your primary care with your blood pressure if you are not sure. Your normal dose is one daily.   ibuprofen 200 MG tablet Commonly known as: ADVIL Take 2-3 tablets (400-600 mg total) by mouth every 6 (six) hours as needed for moderate pain (Do not exceed 3 tablets every 6 hours, you can alternate this with Plain Tylenol).   lisinopril 20 MG tablet Commonly known as: ZESTRIL Take 20 mg by mouth 2 (two) times daily.   rosuvastatin 10 MG tablet Commonly known as: Crestor Take 1 tablet (10 mg total) by mouth daily.   Trelegy Ellipta 200-62.5-25 MCG/ACT Aepb Generic drug: Fluticasone-Umeclidin-Vilant        Allergies:  Allergies  Allergen Reactions   Beta Adrenergic Blockers Other (See Comments)    Wheezing     Past Medical History, Surgical history, Social history, and Family History were reviewed and updated.  Review  of Systems: All other 10 point review of systems is negative.   Physical Exam:  height is 5' 6.14" (1.68 m) and weight is 201 lb 12.8 oz (91.5 kg). Her oral temperature is 98.1 F (36.7 C). Her blood pressure is 136/75 and her pulse is 88. Her respiration is 18 and oxygen saturation is 96%.   Wt Readings from Last 3 Encounters:  10/29/22 201 lb 12.8 oz (91.5 kg)  09/08/21 216 lb 1.9 oz (98 kg)  05/04/21 223 lb (101.2 kg)    Ocular: Sclerae unicteric, pupils equal, round and reactive to light Ear-nose-throat: Oropharynx clear, dentition fair Lymphatic: No cervical or supraclavicular adenopathy Lungs no rales or rhonchi, good excursion bilaterally Heart regular rate and rhythm, no murmur  appreciated Abd soft, nontender, positive bowel sounds MSK no focal spinal tenderness, no joint edema Neuro: non-focal, well-oriented, appropriate affect Breasts: Deferred  Lab Results  Component Value Date   WBC 9.8 10/29/2022   HGB 17.7 (H) 10/29/2022   HCT 53.6 (H) 10/29/2022   MCV 87.4 10/29/2022   PLT 229 10/29/2022   Lab Results  Component Value Date   FERRITIN 99 09/08/2021   IRON 93 09/08/2021   TIBC 414 09/08/2021   UIBC 321 09/08/2021   IRONPCTSAT 22 09/08/2021   Lab Results  Component Value Date   RETICCTPCT 1.6 10/06/2020   RBC 6.13 (H) 10/29/2022   No results found for: "KPAFRELGTCHN", "LAMBDASER", "KAPLAMBRATIO" No results found for: "IGGSERUM", "IGA", "IGMSERUM" No results found for: "TOTALPROTELP", "ALBUMINELP", "A1GS", "A2GS", "BETS", "BETA2SER", "GAMS", "MSPIKE", "SPEI"   Chemistry      Component Value Date/Time   NA 135 10/29/2022 1355   K 4.5 10/29/2022 1355   CL 97 (L) 10/29/2022 1355   CO2 30 10/29/2022 1355   BUN 17 10/29/2022 1355   CREATININE 0.93 10/29/2022 1355      Component Value Date/Time   CALCIUM 10.3 10/29/2022 1355   ALKPHOS 81 10/29/2022 1355   AST 21 10/29/2022 1355   ALT 27 10/29/2022 1355   BILITOT 0.3 10/29/2022 1355       Impression and Plan: Jenny Schneider is a very pleasant 67 yo caucasian female with COPD and history of intermittent erythrocytosis since at least 2019, JAK 2 negative.   We will proceed with phlebotomy today. Patient states that she has eaten and hydrated well today in preparation.  Iron studies are pending.  Follow-up in 6 weeks.   Jenny Stanford, NP 4/12/20242:43 PM

## 2022-10-29 NOTE — Progress Notes (Signed)
Jenny Schneider presents today for phlebotomy per MD orders. Phlebotomy procedure started at 1500 and ended at 1515. 575 grams removed. Patient observed for 30 minutes after procedure without any incident. Patient tolerated procedure well. IV needle removed intact.

## 2022-10-29 NOTE — Patient Instructions (Signed)

## 2022-12-10 ENCOUNTER — Inpatient Hospital Stay: Payer: BC Managed Care – PPO | Admitting: Family

## 2022-12-10 ENCOUNTER — Inpatient Hospital Stay: Payer: BC Managed Care – PPO

## 2022-12-10 ENCOUNTER — Other Ambulatory Visit: Payer: Self-pay

## 2022-12-10 ENCOUNTER — Inpatient Hospital Stay: Payer: BC Managed Care – PPO | Attending: Hematology & Oncology

## 2022-12-10 ENCOUNTER — Encounter: Payer: Self-pay | Admitting: Family

## 2022-12-10 VITALS — BP 133/67 | HR 92 | Temp 98.2°F | Resp 17 | Ht 66.5 in | Wt 204.0 lb

## 2022-12-10 VITALS — BP 124/65 | HR 77

## 2022-12-10 DIAGNOSIS — Z7982 Long term (current) use of aspirin: Secondary | ICD-10-CM | POA: Diagnosis not present

## 2022-12-10 DIAGNOSIS — D508 Other iron deficiency anemias: Secondary | ICD-10-CM

## 2022-12-10 DIAGNOSIS — D751 Secondary polycythemia: Secondary | ICD-10-CM | POA: Insufficient documentation

## 2022-12-10 DIAGNOSIS — Z9889 Other specified postprocedural states: Secondary | ICD-10-CM

## 2022-12-10 DIAGNOSIS — F17210 Nicotine dependence, cigarettes, uncomplicated: Secondary | ICD-10-CM | POA: Diagnosis not present

## 2022-12-10 LAB — CMP (CANCER CENTER ONLY)
ALT: 26 U/L (ref 0–44)
AST: 19 U/L (ref 15–41)
Albumin: 4.4 g/dL (ref 3.5–5.0)
Alkaline Phosphatase: 80 U/L (ref 38–126)
Anion gap: 11 (ref 5–15)
BUN: 16 mg/dL (ref 8–23)
CO2: 30 mmol/L (ref 22–32)
Calcium: 10.7 mg/dL — ABNORMAL HIGH (ref 8.9–10.3)
Chloride: 98 mmol/L (ref 98–111)
Creatinine: 0.9 mg/dL (ref 0.44–1.00)
GFR, Estimated: >60 mL/min (ref >60)
Glucose, Bld: 144 mg/dL — ABNORMAL HIGH (ref 70–99)
Potassium: 4.2 mmol/L (ref 3.5–5.1)
Sodium: 139 mmol/L (ref 135–145)
Total Bilirubin: 0.4 mg/dL (ref 0.3–1.2)
Total Protein: 7.3 g/dL (ref 6.5–8.1)

## 2022-12-10 LAB — CBC WITH DIFFERENTIAL (CANCER CENTER ONLY)
Abs Immature Granulocytes: 0.04 10*3/uL (ref 0.00–0.07)
Basophils Absolute: 0.1 10*3/uL (ref 0.0–0.1)
Basophils Relative: 1 %
Eosinophils Absolute: 0.3 10*3/uL (ref 0.0–0.5)
Eosinophils Relative: 3 %
HCT: 51.4 % — ABNORMAL HIGH (ref 36.0–46.0)
Hemoglobin: 16.6 g/dL — ABNORMAL HIGH (ref 12.0–15.0)
Immature Granulocytes: 0 %
Lymphocytes Relative: 32 %
Lymphs Abs: 3.4 10*3/uL (ref 0.7–4.0)
MCH: 28.4 pg (ref 26.0–34.0)
MCHC: 32.3 g/dL (ref 30.0–36.0)
MCV: 87.9 fL (ref 80.0–100.0)
Monocytes Absolute: 0.7 10*3/uL (ref 0.1–1.0)
Monocytes Relative: 7 %
Neutro Abs: 5.9 10*3/uL (ref 1.7–7.7)
Neutrophils Relative %: 57 %
Platelet Count: 272 10*3/uL (ref 150–400)
RBC: 5.85 MIL/uL — ABNORMAL HIGH (ref 3.87–5.11)
RDW: 13.1 % (ref 11.5–15.5)
WBC Count: 10.4 10*3/uL (ref 4.0–10.5)
nRBC: 0 % (ref 0.0–0.2)

## 2022-12-10 LAB — FERRITIN: Ferritin: 31 ng/mL (ref 11–307)

## 2022-12-10 LAB — IRON AND IRON BINDING CAPACITY (CC-WL,HP ONLY)
Iron: 47 ug/dL (ref 28–170)
Saturation Ratios: 11 % (ref 10.4–31.8)
TIBC: 427 ug/dL (ref 250–450)
UIBC: 380 ug/dL (ref 148–442)

## 2022-12-10 MED ORDER — SODIUM CHLORIDE 0.9 % IV SOLN: INTRAVENOUS | Status: DC

## 2022-12-10 NOTE — Progress Notes (Signed)
[name] presents today for phlebotomy per MD orders. Phlebotomy procedure started at 1341 and ended at 1410. 515 grams removed from lt AC using 18g IV cath by [facility], RN Patient observed for 30 minutes after procedure without any incident. Patient tolerated procedure well. IV needle removed intact.

## 2022-12-10 NOTE — Progress Notes (Signed)
Hematology and Oncology Follow Up Visit  Jenny Schneider [id number] 09-Jun-1956 67 y.o. 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 She is doing fairly well. She notes that with phlebotomy the numbness and tingling in her fingers and feet has improved.  She has some SOB with exertion and notes that the pollen made it a little worse this spring.  She is smoking 1/2 ppd.  Hct today is down to 51%.  No blood loss noted. No bruising or petechiae.  No swelling in her extremities at this time.  No falls or syncope reported. She ambulates with a Rolator for added support.  No fever, chills, n/v, cough, rash, dizziness, chest pain, palpations, abdominal pain or changes in bowel or bladder habits.  Appetite and hydration are good. Weight is stable at 204 lbs.   ECOG Performance Status: 1 - Symptomatic but completely ambulatory  Medications:  Allergies as of 12/10/2022       Reactions   Beta Adrenergic Blockers Other (See Comments)   Wheezing        Medication List        Accurate as of Dec 10, 2022  1:26 PM. If you have any questions, ask your nurse or doctor.          acetaminophen 500 MG tablet Commonly known as: TYLENOL You can take 1000 mg of Tylenol every 8 hours.  Use this as your first treatment for pain.  Continue as long as you need pain medicine.  You can buy this over the counter at any drug store.   You can alternate this with ibuprofen and Tramadol if you need a third pain medicine.   albuterol 108 (90 Base) MCG/ACT inhaler Commonly known as: VENTOLIN HFA Inhale into the lungs every 6 (six) hours as needed for wheezing or shortness of breath.   aspirin EC 81 MG tablet Take 81 mg by mouth daily.   citalopram 10 MG tablet Commonly known as: CELEXA Take 10 mg by mouth daily.   dicyclomine 20 MG tablet Commonly  known as: BENTYL Take 20 mg by mouth daily.   fexofenadine 180 MG tablet Commonly known as: ALLEGRA Take 180 mg by mouth daily.   glipiZIDE 5 MG tablet Commonly known as: GLUCOTROL Take 5 mg by mouth daily before breakfast.   hydrochlorothiazide 12.5 MG tablet Commonly known as: HYDRODIURIL Check your blood pressure on in AM before taking your blood pressure medicines, this is one.  Be sure BP is more than 100/60 before restarting this medicine.    Call your primary care with your blood pressure if you are not sure. Your normal dose is one daily.   ibuprofen 200 MG tablet Commonly known as: ADVIL Take 2-3 tablets (400-600 mg total) by mouth every 6 (six) hours as needed for moderate pain (Do not exceed 3 tablets every 6 hours, you can alternate this with Plain Tylenol).   lisinopril 20 MG tablet Commonly known as: ZESTRIL Take 20 mg by mouth 2 (two) times daily.   rosuvastatin 10 MG tablet Commonly known as: Crestor Take 1 tablet (10 mg total) by mouth daily.   Trelegy Ellipta 200-62.5-25 MCG/ACT Aepb Generic drug: Fluticasone-Umeclidin-Vilant        Allergies:  Allergies  Allergen Reactions   Beta Adrenergic Blockers Other (See Comments)    Wheezing  Past Medical History, Surgical history, Social history, and Family History were reviewed and updated.  Review of Systems: All other 10 point review of systems is negative.   Physical Exam:  vitals were not taken for this visit.   Wt Readings from Last 3 Encounters:  10/29/22 201 lb 12.8 oz (91.5 kg)  09/08/21 216 lb 1.9 oz (98 kg)  05/04/21 223 lb (101.2 kg)    Ocular: Sclerae unicteric, pupils equal, round and reactive to light Ear-nose-throat: Oropharynx clear, dentition fair Lymphatic: No cervical or supraclavicular adenopathy Lungs no rales or rhonchi, good excursion bilaterally Heart regular rate and rhythm, no murmur appreciated Abd soft, nontender, positive bowel sounds MSK no focal spinal  tenderness, no joint edema Neuro: non-focal, well-oriented, appropriate affect Breasts: Deferred   Lab Results  Component Value Date   WBC 10.4 12/10/2022   HGB 16.6 (H) 12/10/2022   HCT 51.4 (H) 12/10/2022   MCV 87.9 12/10/2022   PLT 272 12/10/2022   Lab Results  Component Value Date   FERRITIN 67 10/29/2022   IRON 63 10/29/2022   TIBC 384 10/29/2022   UIBC 321 10/29/2022   IRONPCTSAT 16 10/29/2022   Lab Results  Component Value Date   RETICCTPCT 1.6 10/06/2020   RBC 5.85 (H) 12/10/2022   No results found for: "KPAFRELGTCHN", "LAMBDASER", "KAPLAMBRATIO" No results found for: "IGGSERUM", "IGA", "IGMSERUM" No results found for: "TOTALPROTELP", "ALBUMINELP", "A1GS", "A2GS", "BETS", "BETA2SER", "GAMS", "MSPIKE", "SPEI"   Chemistry      Component Value Date/Time   NA 135 10/29/2022 1355   K 4.5 10/29/2022 1355   CL 97 (L) 10/29/2022 1355   CO2 30 10/29/2022 1355   BUN 17 10/29/2022 1355   CREATININE 0.93 10/29/2022 1355      Component Value Date/Time   CALCIUM 10.3 10/29/2022 1355   ALKPHOS 81 10/29/2022 1355   AST 21 10/29/2022 1355   ALT 27 10/29/2022 1355   BILITOT 0.3 10/29/2022 1355       Impression and Plan: Jenny Schneider is a very pleasant 67 yo caucasian female with COPD and history of intermittent erythrocytosis since at least 2019, JAK 2 negative.   No phlebotomy needed at this time.  Iron studies are pending.  Follow-up in 8 weeks.    Eileen Stanford, NP 5/24/20241:26 PM

## 2022-12-10 NOTE — Patient Instructions (Signed)
.Dehydration, Adult Dehydration is a condition in which there is not enough water or other fluids in the body. This happens when a person loses more fluids than they take in. Important organs, such as the kidneys, brain, and heart, cannot function without a proper amount of fluids. Any loss of fluids from the body can lead to dehydration. Dehydration can be mild, moderate, or severe. It should be treated right away to prevent it from becoming severe. What are the causes? Dehydration may be caused by: Health conditions, such as diarrhea, vomiting, fever, infection, or sweating or urinating a lot. Not drinking enough fluids. Certain medicines, such as medicines that remove excess fluid from the body (diuretics). Lack of safe drinking water. Not being able to get enough water and food. What increases the risk? The following factors may make you more likely to develop this condition: Having a long-term (chronic) illness that has not been treated properly, such as diabetes, heart disease, or kidney disease. Being 60 years of age or older. Having a disability. Living in a place that is high in altitude, where thinner, drier air causes more fluid loss. Doing exercises that put stress on your body for a long time (endurance sports). Being active in a hot climate. What are the signs or symptoms? Symptoms of dehydration depend on how severe it is. Mild or moderate dehydration Thirst. Dry lips or dry mouth. Dizziness or light-headedness. Muscle cramps. Dark urine. Urine may be the color of tea. Less urine or tears produced than usual. Headache. Severe dehydration Changes in skin. Your skin may be cold and clammy, blotchy, or pale. Your skin also may not return to normal after being lightly pinched and released. Little or no tears, urine, or sweat. Rapid breathing and low blood pressure. Your pulse may be weak or may be faster than 100 beats per minute when you are sitting still. Other changes,  such as: Feeling very thirsty. Sunken eyes. Cold hands and feet. Confusion. Being very tired (lethargic) or having trouble waking from sleep. Short-term weight loss. Loss of consciousness. How is this diagnosed? This condition is diagnosed based on your symptoms and a physical exam. You may have blood and urine tests to help confirm the diagnosis. How is this treated? Treatment for this condition depends on how severe it is. Treatment should be started right away. Do not wait until dehydration becomes severe. Severe dehydration is an emergency and needs to be treated in a hospital. Mild or moderate dehydration can be treated at home. You may be asked to: Drink more fluids. Drink an oral rehydration solution (ORS). This drink restores fluids, salts, and minerals in the blood (electrolytes). Stop any activities that caused dehydration, such as exercise. Cool off with cool compresses, cool mist, or cool fluids, if heat or too much sweat caused your condition. Take medicine to treat fever, if fever caused your condition. Take medicine to treat nausea and diarrhea, if vomiting or diarrhea caused your condition. Severe dehydration can be treated: With IV fluids. By correcting abnormal levels of electrolytes in your body. By treating the underlying cause of dehydration. Follow these instructions at home: Oral rehydration solution If told by your health care provider, drink an ORS: Make an ORS by following instructions on the package. Start by drinking small amounts, about  cup (120 mL) every 5-10 minutes. Slowly increase how much you drink until you have taken the amount recommended by your health care provider.  Eating and drinking  Drink enough clear fluid to keep  your urine pale yellow. If you were told to drink an ORS, finish the ORS first and then start slowly drinking other clear fluids. Drink fluids such as: Water. Do not drink only water. Doing that can lead to hyponatremia, which  is having too little salt (sodium) in the body. Water from ice chips you suck on. Diluted fruit juice. This is fruit juice that you have added water to. Low-calorie sports drinks. Eat foods that contain a healthy balance of electrolytes, such as bananas, oranges, potatoes, tomatoes, and spinach. Do not drink alcohol. Avoid the following: Drinks that contain a lot of sugar. These include high-calorie sports drinks, fruit juice that is not diluted, and soda. Caffeine. Foods that are greasy or contain a lot of fat or sugar. General instructions Take over-the-counter and prescription medicines only as told by your health care provider. Do not take sodium tablets. Doing that can lead to having too much sodium in the body (hypernatremia). Return to your normal activities as told by your health care provider. Ask your health care provider what activities are safe for you. Keep all follow-up visits. Your health care provider may need to check your progress and suggest new ways to treat your condition. Contact a health care provider if: You have muscle cramps, pain, or discomfort, such as: Pain in your abdomen and the pain gets worse or stays in one area. Stiff neck. You have a rash. You are more irritable than usual. You are sleepier or have a harder time waking. You feel weak or dizzy. You feel very thirsty. Get help right away if: You have symptoms of severe dehydration. You vomit every time you eat or drink. Your vomiting gets worse, does not go away, or includes blood or green matter (bile). You are getting treatment but symptoms are getting worse. You have a fever. You have a severe headache. You have: Diarrhea that gets worse or does not go away. Blood in your stool. This may cause stool to look black and tarry. Not urinating, or urinating only a small amount of very dark urine, within 6-8 hours. You have trouble breathing. These symptoms may be an emergency. Get help right  away. Do not wait to see if the symptoms will go away. Do not drive yourself to the hospital. Call 911. This information is not intended to replace advice given to you by your health care provider. Make sure you discuss any questions you have with your health care provider. Document Revised: 02/01/2022 Document Reviewed: 02/01/2022 Elsevier Patient Education  2024 [employer]  Therapeutic Phlebotomy Therapeutic phlebotomy is the planned removal of blood from a person's body for the purpose of treating a medical condition. The procedure is lot like donating blood. Usually, about a pint (470 mL, or 0.47 L) of blood is removed. The average adult has 9-12 pints (4.3-5.7 L) of blood in his or her body. Therapeutic phlebotomy may be used to treat the following medical conditions: Hemochromatosis. This is a condition in which the blood contains too much iron. Polycythemia vera. This is a condition in which the blood contains too many red blood cells. Porphyria cutanea tarda. This is a disease in which an important part of hemoglobin is not made properly. It results in the buildup of abnormal amounts of porphyrins in the body. Sickle cell disease. This is a condition in which the red blood cells form an abnormal crescent shape rather than a round shape. Tell a health care provider about: Any allergies you have. All medicines  you are taking, including vitamins, herbs, eye drops, creams, and over-the-counter medicines. Any bleeding problems you have. Any surgeries you have had. Any medical conditions you have. Whether you are pregnant or may be pregnant. What are the risks? Generally, this is a safe procedure. However, problems may occur, including: Nausea or light-headedness. Low blood pressure (hypotension). Soreness, bleeding, swelling, or bruising at the needle insertion site. Infection. What happens before the procedure? Ask your health care provider about: Changing or stopping your  regular medicines. This is especially important if you are taking diabetes medicines or blood thinners. Taking medicines such as aspirin and ibuprofen. These medicines can thin your blood. Do not take these medicines unless your health care provider tells you to take them. Taking over-the-counter medicines, vitamins, herbs, and supplements. Wear clothing with sleeves that can be raised above the elbow. You may have a blood sample taken. Your blood pressure, pulse rate, and breathing rate will be measured. What happens during the procedure?  You may be given a medicine to numb the area (local anesthetic). A tourniquet will be placed on your arm. A needle will be put into one of your veins. Tubing and a collection bag will be attached to the needle. Blood will flow through the needle and tubing into the collection bag. The collection bag will be placed lower than your arm so gravity can help the blood flow into the bag. You may be asked to open and close your hand slowly and continually during the entire collection. After the specified amount of blood has been removed from your body, the collection bag and tubing will be clamped. The needle will be removed from your vein. Pressure will be held on the needle site to stop the bleeding. A bandage (dressing) will be placed over the needle insertion site. The procedure may vary among health care providers and hospitals. What happens after the procedure? Your blood pressure, pulse rate, and breathing rate will be measured after the procedure. You will be encouraged to drink fluids. You will be encouraged to eat a snack to prevent a low blood sugar level. Your recovery will be assessed and monitored. Return to your normal activities as told by your health care provider. Summary Therapeutic phlebotomy is the planned removal of blood from a person's body for the purpose of treating a medical condition. Therapeutic phlebotomy may be used to treat  hemochromatosis, polycythemia vera, porphyria cutanea tarda, or sickle cell disease. In the procedure, a needle is inserted and about a pint (470 mL, or 0.47 L) of blood is removed. The average adult has 9-12 pints (4.3-5.7 L) of blood in the body. This is generally a safe procedure, but it can sometimes cause problems such as nausea, light-headedness, or low blood pressure (hypotension). This information is not intended to replace advice given to you by your health care provider. Make sure you discuss any questions you have with your health care provider. Document Revised: 12/31/2020 Document Reviewed: 12/31/2020 Elsevier Patient Education  2024 [employer].

## 2023-02-28 ENCOUNTER — Inpatient Hospital Stay: Payer: BC Managed Care – PPO

## 2023-02-28 ENCOUNTER — Inpatient Hospital Stay: Payer: BC Managed Care – PPO | Admitting: Family

## 2023-02-28 ENCOUNTER — Inpatient Hospital Stay: Payer: BC Managed Care – PPO | Attending: Hematology & Oncology

## 2023-02-28 VITALS — BP 153/71 | HR 83 | Temp 97.9°F | Resp 19 | Ht 66.0 in | Wt 205.8 lb

## 2023-02-28 VITALS — BP 129/66 | HR 70 | Resp 20

## 2023-02-28 DIAGNOSIS — F17200 Nicotine dependence, unspecified, uncomplicated: Secondary | ICD-10-CM | POA: Insufficient documentation

## 2023-02-28 DIAGNOSIS — R42 Dizziness and giddiness: Secondary | ICD-10-CM | POA: Insufficient documentation

## 2023-02-28 DIAGNOSIS — D751 Secondary polycythemia: Secondary | ICD-10-CM | POA: Insufficient documentation

## 2023-02-28 DIAGNOSIS — J449 Chronic obstructive pulmonary disease, unspecified: Secondary | ICD-10-CM | POA: Insufficient documentation

## 2023-02-28 DIAGNOSIS — R202 Paresthesia of skin: Secondary | ICD-10-CM | POA: Insufficient documentation

## 2023-02-28 DIAGNOSIS — D508 Other iron deficiency anemias: Secondary | ICD-10-CM

## 2023-02-28 DIAGNOSIS — Z9889 Other specified postprocedural states: Secondary | ICD-10-CM

## 2023-02-28 LAB — FERRITIN: Ferritin: 13 ng/mL (ref 11–307)

## 2023-02-28 LAB — CBC WITH DIFFERENTIAL (CANCER CENTER ONLY)
Abs Immature Granulocytes: 0.02 10*3/uL (ref 0.00–0.07)
Basophils Absolute: 0.1 10*3/uL (ref 0.0–0.1)
Basophils Relative: 1 %
Eosinophils Absolute: 0.3 10*3/uL (ref 0.0–0.5)
Eosinophils Relative: 3 %
HCT: 49 % — ABNORMAL HIGH (ref 36.0–46.0)
Hemoglobin: 15.4 g/dL — ABNORMAL HIGH (ref 12.0–15.0)
Immature Granulocytes: 0 %
Lymphocytes Relative: 31 %
Lymphs Abs: 2.8 10*3/uL (ref 0.7–4.0)
MCH: 26.9 pg (ref 26.0–34.0)
MCHC: 31.4 g/dL (ref 30.0–36.0)
MCV: 85.7 fL (ref 80.0–100.0)
Monocytes Absolute: 0.6 10*3/uL (ref 0.1–1.0)
Monocytes Relative: 7 %
Neutro Abs: 5.1 10*3/uL (ref 1.7–7.7)
Neutrophils Relative %: 58 %
Platelet Count: 244 10*3/uL (ref 150–400)
RBC: 5.72 MIL/uL — ABNORMAL HIGH (ref 3.87–5.11)
RDW: 13.3 % (ref 11.5–15.5)
WBC Count: 8.8 10*3/uL (ref 4.0–10.5)
nRBC: 0 % (ref 0.0–0.2)

## 2023-02-28 LAB — CMP (CANCER CENTER ONLY)
ALT: 18 U/L (ref 0–44)
AST: 18 U/L (ref 15–41)
Albumin: 4 g/dL (ref 3.5–5.0)
Alkaline Phosphatase: 76 U/L (ref 38–126)
Anion gap: 7 (ref 5–15)
BUN: 16 mg/dL (ref 8–23)
CO2: 29 mmol/L (ref 22–32)
Calcium: 9.7 mg/dL (ref 8.9–10.3)
Chloride: 101 mmol/L (ref 98–111)
Creatinine: 0.88 mg/dL (ref 0.44–1.00)
GFR, Estimated: >60 mL/min (ref >60)
Glucose, Bld: 207 mg/dL — ABNORMAL HIGH (ref 70–99)
Potassium: 4 mmol/L (ref 3.5–5.1)
Sodium: 137 mmol/L (ref 135–145)
Total Bilirubin: 0.3 mg/dL (ref 0.3–1.2)
Total Protein: 7.1 g/dL (ref 6.5–8.1)

## 2023-02-28 LAB — IRON AND IRON BINDING CAPACITY (CC-WL,HP ONLY)
Iron: 52 ug/dL (ref 28–170)
Saturation Ratios: 11 % (ref 10.4–31.8)
TIBC: 455 ug/dL — ABNORMAL HIGH (ref 250–450)
UIBC: 403 ug/dL (ref 148–442)

## 2023-02-28 MED ORDER — SODIUM CHLORIDE 0.9 % IV SOLN: INTRAVENOUS | Status: DC

## 2023-02-28 NOTE — Progress Notes (Signed)
Hematology and Oncology Follow Up Visit  Jenny Schneider [id number] July 01, 1956 67 y.o. 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 Jenny Schneider is here today with her son for follow-up. Overall she is doing well but does note fatigue at time as well tingling in the ring and pinky fingers of her right hand.  She is doing well on daily baby aspirin.  No issues with blood loss, bruising or petechiae.  Occasional dizziness when she stands too quickly.  SOB associated with asthma or COPD exacerbation unchanged from baseline.  She is still smoking.  No fever, chills, n/v, cough, rash, chest pain, palpitations, abdominal pain or changes in bowel or bladder habits.  No swelling in her extremities.  She ambulates with her Rolator. No falls or syncope reported.  Appetite and hydration are good. Weight is stable at 205 lbs.   ECOG Performance Status: 1 - Symptomatic but completely ambulatory  Medications:  Allergies as of 02/28/2023       Reactions   Beta Adrenergic Blockers Other (See Comments)   Wheezing        Medication List        Accurate as of February 28, 2023  1:49 PM. If you have any questions, ask your nurse or doctor.          acetaminophen 500 MG tablet Commonly known as: TYLENOL You can take 1000 mg of Tylenol every 8 hours.  Use this as your first treatment for pain.  Continue as long as you need pain medicine.  You can buy this over the counter at any drug store.   You can alternate this with ibuprofen and Tramadol if you need a third pain medicine.   albuterol 108 (90 Base) MCG/ACT inhaler Commonly known as: VENTOLIN HFA Inhale into the lungs every 6 (six) hours as needed for wheezing or shortness of breath.   aspirin EC 81 MG tablet Take 81 mg by mouth daily.   citalopram 10 MG tablet Commonly known as: CELEXA Take 10 mg by mouth daily.   dicyclomine  20 MG tablet Commonly known as: BENTYL Take 20 mg by mouth daily.   fexofenadine 180 MG tablet Commonly known as: ALLEGRA Take 180 mg by mouth daily.   glipiZIDE 5 MG tablet Commonly known as: GLUCOTROL Take 5 mg by mouth daily before breakfast.   hydrochlorothiazide 12.5 MG tablet Commonly known as: HYDRODIURIL Check your blood pressure on in AM before taking your blood pressure medicines, this is one.  Be sure BP is more than 100/60 before restarting this medicine.    Call your primary care with your blood pressure if you are not sure. Your normal dose is one daily.   ibuprofen 200 MG tablet Commonly known as: ADVIL Take 2-3 tablets (400-600 mg total) by mouth every 6 (six) hours as needed for moderate pain (Do not exceed 3 tablets every 6 hours, you can alternate this with Plain Tylenol).   lisinopril 20 MG tablet Commonly known as: ZESTRIL Take 20 mg by mouth 2 (two) times daily.   rosuvastatin 10 MG tablet Commonly known as: Crestor Take 1 tablet (10 mg total) by mouth daily.   Trelegy Ellipta 200-62.5-25 MCG/INH Aepb Generic drug: Fluticasone-Umeclidin-Vilant        Allergies:  Allergies  Allergen Reactions   Beta Adrenergic Blockers Other (See Comments)    Wheezing  Past Medical History, Surgical history, Social history, and Family History were reviewed and updated.  Review of Systems: All other 10 point review of systems is negative.   Physical Exam:  vitals were not taken for this visit.   Wt Readings from Last 3 Encounters:  12/10/22 204 lb 0.6 oz (92.6 kg)  10/29/22 201 lb 12.8 oz (91.5 kg)  09/08/21 216 lb 1.9 oz (98 kg)    Ocular: Sclerae unicteric, pupils equal, round and reactive to light Ear-nose-throat: Oropharynx clear, dentition fair Lymphatic: No cervical or supraclavicular adenopathy Lungs no rales or rhonchi, good excursion bilaterally Heart regular rate and rhythm, no murmur appreciated Abd soft, nontender, positive bowel  sounds MSK no focal spinal tenderness, no joint edema Neuro: non-focal, well-oriented, appropriate affect Breasts: Deferred   Lab Results  Component Value Date   WBC 8.8 02/28/2023   HGB 15.4 (H) 02/28/2023   HCT 49.0 (H) 02/28/2023   MCV 85.7 02/28/2023   PLT 244 02/28/2023   Lab Results  Component Value Date   FERRITIN 31 12/10/2022   IRON 47 12/10/2022   TIBC 427 12/10/2022   UIBC 380 12/10/2022   IRONPCTSAT 11 12/10/2022   Lab Results  Component Value Date   RETICCTPCT 1.6 10/06/2020   RBC 5.72 (H) 02/28/2023   No results found for: "KPAFRELGTCHN", "LAMBDASER", "KAPLAMBRATIO" No results found for: "IGGSERUM", "IGA", "IGMSERUM" No results found for: "TOTALPROTELP", "ALBUMINELP", "A1GS", "A2GS", "BETS", "BETA2SER", "GAMS", "MSPIKE", "SPEI"   Chemistry      Component Value Date/Time   NA 139 12/10/2022 1300   K 4.2 12/10/2022 1300   CL 98 12/10/2022 1300   CO2 30 12/10/2022 1300   BUN 16 12/10/2022 1300   CREATININE 0.90 12/10/2022 1300      Component Value Date/Time   CALCIUM 10.7 (H) 12/10/2022 1300   ALKPHOS 80 12/10/2022 1300   AST 19 12/10/2022 1300   ALT 26 12/10/2022 1300   BILITOT 0.4 12/10/2022 1300       Impression and Plan: Jenny Schneider is a very pleasant 66 yo caucasian female with COPD and history of intermittent erythrocytosis since at least 2019, JAK 2 negative.   We will proceed with phlebotomy today and replacement fluids after.  Iron studies are pending.  Follow-up in 2 months.     Jenny Stanford, NP 8/12/20241:49 PM

## 2023-02-28 NOTE — Progress Notes (Signed)
Jenny Schneider presents today for phlebotomy per MD orders. Phlebotomy procedure started at 1410 vi 20 g angiocath and ended at 1430. 540 grams removed.  IVF Started for 500 ml's. Patient observed for 30 minutes after procedure without any incident. Patient tolerated procedure well. IV needle removed intact.

## 2023-04-18 ENCOUNTER — Telehealth: Payer: Self-pay | Admitting: Medical Oncology

## 2023-04-18 NOTE — Telephone Encounter (Signed)
Left vm for pt regarding rescheduling appointment on 04/29/2023. Gave patient new appt date and time and provided name and call back number.

## 2023-04-29 ENCOUNTER — Other Ambulatory Visit: Payer: BC Managed Care – PPO

## 2023-04-29 ENCOUNTER — Ambulatory Visit: Payer: BC Managed Care – PPO | Admitting: Family

## 2023-05-05 ENCOUNTER — Inpatient Hospital Stay: Payer: BC Managed Care – PPO

## 2023-05-05 ENCOUNTER — Ambulatory Visit: Payer: BC Managed Care – PPO | Admitting: Medical Oncology

## 2023-05-10 ENCOUNTER — Ambulatory Visit: Payer: BC Managed Care – PPO | Admitting: Medical Oncology

## 2023-05-10 ENCOUNTER — Inpatient Hospital Stay: Payer: BC Managed Care – PPO

## 2023-05-19 ENCOUNTER — Inpatient Hospital Stay: Payer: BC Managed Care – PPO | Attending: Hematology & Oncology

## 2023-05-19 ENCOUNTER — Inpatient Hospital Stay: Payer: BC Managed Care – PPO

## 2023-05-19 ENCOUNTER — Encounter: Payer: Self-pay | Admitting: Medical Oncology

## 2023-05-19 ENCOUNTER — Inpatient Hospital Stay: Payer: BC Managed Care – PPO | Admitting: Medical Oncology

## 2023-05-19 VITALS — BP 124/50 | HR 93 | Temp 98.1°F | Resp 18 | Wt 204.0 lb

## 2023-05-19 DIAGNOSIS — Z7982 Long term (current) use of aspirin: Secondary | ICD-10-CM | POA: Diagnosis not present

## 2023-05-19 DIAGNOSIS — D509 Iron deficiency anemia, unspecified: Secondary | ICD-10-CM | POA: Insufficient documentation

## 2023-05-19 DIAGNOSIS — D751 Secondary polycythemia: Secondary | ICD-10-CM | POA: Insufficient documentation

## 2023-05-19 DIAGNOSIS — R42 Dizziness and giddiness: Secondary | ICD-10-CM | POA: Diagnosis not present

## 2023-05-19 DIAGNOSIS — J449 Chronic obstructive pulmonary disease, unspecified: Secondary | ICD-10-CM | POA: Insufficient documentation

## 2023-05-19 LAB — CBC WITH DIFFERENTIAL (CANCER CENTER ONLY)
Abs Immature Granulocytes: 0.05 10*3/uL (ref 0.00–0.07)
Basophils Absolute: 0.1 10*3/uL (ref 0.0–0.1)
Basophils Relative: 1 %
Eosinophils Absolute: 0.4 10*3/uL (ref 0.0–0.5)
Eosinophils Relative: 4 %
HCT: 47.7 % — ABNORMAL HIGH (ref 36.0–46.0)
Hemoglobin: 14.7 g/dL (ref 12.0–15.0)
Immature Granulocytes: 0 %
Lymphocytes Relative: 29 %
Lymphs Abs: 3.3 10*3/uL (ref 0.7–4.0)
MCH: 24.5 pg — ABNORMAL LOW (ref 26.0–34.0)
MCHC: 30.8 g/dL (ref 30.0–36.0)
MCV: 79.4 fL — ABNORMAL LOW (ref 80.0–100.0)
Monocytes Absolute: 0.8 10*3/uL (ref 0.1–1.0)
Monocytes Relative: 8 %
Neutro Abs: 6.5 10*3/uL (ref 1.7–7.7)
Neutrophils Relative %: 58 %
Platelet Count: 287 10*3/uL (ref 150–400)
RBC: 6.01 MIL/uL — ABNORMAL HIGH (ref 3.87–5.11)
RDW: 14.9 % (ref 11.5–15.5)
WBC Count: 11.2 10*3/uL — ABNORMAL HIGH (ref 4.0–10.5)
nRBC: 0 % (ref 0.0–0.2)

## 2023-05-19 LAB — CMP (CANCER CENTER ONLY)
ALT: 19 U/L (ref 0–44)
AST: 19 U/L (ref 15–41)
Albumin: 4.4 g/dL (ref 3.5–5.0)
Alkaline Phosphatase: 79 U/L (ref 38–126)
Anion gap: 8 (ref 5–15)
BUN: 18 mg/dL (ref 8–23)
CO2: 28 mmol/L (ref 22–32)
Calcium: 9.9 mg/dL (ref 8.9–10.3)
Chloride: 102 mmol/L (ref 98–111)
Creatinine: 0.99 mg/dL (ref 0.44–1.00)
GFR, Estimated: >60 mL/min (ref >60)
Glucose, Bld: 150 mg/dL — ABNORMAL HIGH (ref 70–99)
Potassium: 4.2 mmol/L (ref 3.5–5.1)
Sodium: 138 mmol/L (ref 135–145)
Total Bilirubin: 0.2 mg/dL — ABNORMAL LOW (ref 0.3–1.2)
Total Protein: 7.3 g/dL (ref 6.5–8.1)

## 2023-05-19 LAB — FERRITIN: Ferritin: 11 ng/mL (ref 11–307)

## 2023-05-19 NOTE — Progress Notes (Signed)
Hematology and Oncology Follow Up Visit  Jenny Schneider [id number] 1955-08-30 67 y.o. 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   She reports that she has been doing well.  She is doing well on daily baby aspirin.  No issues with blood loss, bruising or petechiae.  Occasional dizziness when she stands too quickly.  SOB associated with asthma or COPD exacerbation unchanged from baseline.  She is still smoking.  No fever, chills, n/v, cough, rash, chest pain, palpitations, abdominal pain or changes in bowel or bladder habits.  No swelling in her extremities.  She ambulates with her Rolator. No falls or syncope reported.  Appetite and hydration are good.  Wt Readings from Last 3 Encounters:  05/19/23 204 lb 0.6 oz (92.6 kg)  02/28/23 205 lb 12.8 oz (93.4 kg)  12/10/22 204 lb 0.6 oz (92.6 kg)   ECOG Performance Status: 1 - Symptomatic but completely ambulatory  Medications:  Allergies as of 05/19/2023       Reactions   Beta Adrenergic Blockers Other (See Comments)   Wheezing        Medication List        Accurate as of May 19, 2023  2:44 PM. If you have any questions, ask your nurse or doctor.          acetaminophen 500 MG tablet Commonly known as: TYLENOL You can take 1000 mg of Tylenol every 8 hours.  Use this as your first treatment for pain.  Continue as long as you need pain medicine.  You can buy this over the counter at any drug store.   You can alternate this with ibuprofen and Tramadol if you need a third pain medicine.   albuterol 108 (90 Base) MCG/ACT inhaler Commonly known as: VENTOLIN HFA Inhale into the lungs every 6 (six) hours as needed for wheezing or shortness of breath.   aspirin EC 81 MG tablet Take 81 mg by mouth daily.   citalopram 10 MG tablet Commonly known as:  CELEXA Take 10 mg by mouth daily.   dicyclomine 20 MG tablet Commonly known as: BENTYL Take 20 mg by mouth daily.   fexofenadine 180 MG tablet Commonly known as: ALLEGRA Take 180 mg by mouth daily.   glipiZIDE 5 MG tablet Commonly known as: GLUCOTROL Take 5 mg by mouth daily before breakfast.   hydrochlorothiazide 12.5 MG tablet Commonly known as: HYDRODIURIL Check your blood pressure on in AM before taking your blood pressure medicines, this is one.  Be sure BP is more than 100/60 before restarting this medicine.    Call your primary care with your blood pressure if you are not sure. Your normal dose is one daily.   ibuprofen 200 MG tablet Commonly known as: ADVIL Take 2-3 tablets (400-600 mg total) by mouth every 6 (six) hours as needed for moderate pain (Do not exceed 3 tablets every 6 hours, you can alternate this with Plain Tylenol).   lisinopril 20 MG tablet Commonly known as: ZESTRIL Take 20 mg by mouth 2 (two) times daily.   rosuvastatin 10 MG tablet Commonly known as: Crestor Take 1 tablet (10 mg total) by mouth daily.   Trelegy Ellipta 200-62.5-25 MCG/INH Aepb Generic drug: Fluticasone-Umeclidin-Vilant        Allergies:  Allergies  Allergen Reactions  Beta Adrenergic Blockers Other (See Comments)    Wheezing     Past Medical History, Surgical history, Social history, and Family History were reviewed and updated.  Review of Systems: All other 10 point review of systems is negative.   Physical Exam:  weight is 204 lb 0.6 oz (92.6 kg). Her oral temperature is 98.1 F (36.7 C). Her blood pressure is 124/50 (abnormal) and her pulse is 93. Her respiration is 18 and oxygen saturation is 95%.   Wt Readings from Last 3 Encounters:  05/19/23 204 lb 0.6 oz (92.6 kg)  02/28/23 205 lb 12.8 oz (93.4 kg)  12/10/22 204 lb 0.6 oz (92.6 kg)    Ocular: Sclerae unicteric, pupils equal, round and reactive to light Ear-nose-throat: Oropharynx clear, dentition  fair Lymphatic: No cervical or supraclavicular adenopathy Lungs no rales or rhonchi, good excursion bilaterally Heart regular rate and rhythm, no murmur appreciated Abd soft, nontender, positive bowel sounds MSK no focal spinal tenderness, no joint edema Neuro: non-focal, well-oriented, appropriate affect  Lab Results  Component Value Date   WBC 11.2 (H) 05/19/2023   HGB 14.7 05/19/2023   HCT 47.7 (H) 05/19/2023   MCV 79.4 (L) 05/19/2023   PLT 287 05/19/2023   Lab Results  Component Value Date   FERRITIN 13 02/28/2023   IRON 52 02/28/2023   TIBC 455 (H) 02/28/2023   UIBC 403 02/28/2023   IRONPCTSAT 11 02/28/2023   Lab Results  Component Value Date   RETICCTPCT 1.6 10/06/2020   RBC 6.01 (H) 05/19/2023   No results found for: "KPAFRELGTCHN", "LAMBDASER", "KAPLAMBRATIO" No results found for: "IGGSERUM", "IGA", "IGMSERUM" No results found for: "TOTALPROTELP", "ALBUMINELP", "A1GS", "A2GS", "BETS", "BETA2SER", "GAMS", "MSPIKE", "SPEI"   Chemistry      Component Value Date/Time   NA 137 02/28/2023 1307   K 4.0 02/28/2023 1307   CL 101 02/28/2023 1307   CO2 29 02/28/2023 1307   BUN 16 02/28/2023 1307   CREATININE 0.88 02/28/2023 1307      Component Value Date/Time   CALCIUM 9.7 02/28/2023 1307   ALKPHOS 76 02/28/2023 1307   AST 18 02/28/2023 1307   ALT 18 02/28/2023 1307   BILITOT 0.3 02/28/2023 1307     Encounter Diagnosis  Name Primary?   Erythrocytosis Yes    Impression and Plan: Jenny Schneider is a very pleasant 67 yo caucasian female with COPD and history of intermittent erythrocytosis since at least 2019, JAK 2 negative.    No phlebotomy today Iron studies are pending.  RTC 2 months APP, labs(CBC, CMP, iron, ferritin, retic) +_ phlebotomy   Jenny Chestnut, PA-C 10/31/20242:44 PM

## 2023-05-20 LAB — IRON AND IRON BINDING CAPACITY (CC-WL,HP ONLY)
Iron: 25 ug/dL — ABNORMAL LOW (ref 28–170)
Saturation Ratios: 5 % — ABNORMAL LOW (ref 10.4–31.8)
TIBC: 511 ug/dL — ABNORMAL HIGH (ref 250–450)
UIBC: 486 ug/dL — ABNORMAL HIGH (ref 148–442)

## 2023-07-04 ENCOUNTER — Other Ambulatory Visit: Payer: BC Managed Care – PPO

## 2023-07-04 ENCOUNTER — Ambulatory Visit: Payer: BC Managed Care – PPO | Admitting: Medical Oncology

## 2023-07-08 ENCOUNTER — Inpatient Hospital Stay: Payer: BC Managed Care – PPO | Attending: Hematology & Oncology

## 2023-07-08 ENCOUNTER — Inpatient Hospital Stay (HOSPITAL_BASED_OUTPATIENT_CLINIC_OR_DEPARTMENT_OTHER): Payer: BC Managed Care – PPO | Admitting: Family

## 2023-07-08 ENCOUNTER — Inpatient Hospital Stay: Payer: BC Managed Care – PPO

## 2023-07-08 VITALS — BP 120/73 | HR 87 | Temp 98.1°F | Resp 16 | Ht 66.0 in | Wt 198.4 lb

## 2023-07-08 DIAGNOSIS — Z7982 Long term (current) use of aspirin: Secondary | ICD-10-CM | POA: Insufficient documentation

## 2023-07-08 DIAGNOSIS — J449 Chronic obstructive pulmonary disease, unspecified: Secondary | ICD-10-CM | POA: Diagnosis not present

## 2023-07-08 DIAGNOSIS — R200 Anesthesia of skin: Secondary | ICD-10-CM | POA: Insufficient documentation

## 2023-07-08 DIAGNOSIS — D751 Secondary polycythemia: Secondary | ICD-10-CM | POA: Diagnosis present

## 2023-07-08 DIAGNOSIS — Z79899 Other long term (current) drug therapy: Secondary | ICD-10-CM | POA: Diagnosis not present

## 2023-07-08 DIAGNOSIS — D508 Other iron deficiency anemias: Secondary | ICD-10-CM | POA: Diagnosis not present

## 2023-07-08 DIAGNOSIS — R202 Paresthesia of skin: Secondary | ICD-10-CM | POA: Diagnosis not present

## 2023-07-08 LAB — CBC WITH DIFFERENTIAL (CANCER CENTER ONLY)
Abs Immature Granulocytes: 0.03 10*3/uL (ref 0.00–0.07)
Basophils Absolute: 0.1 10*3/uL (ref 0.0–0.1)
Basophils Relative: 1 %
Eosinophils Absolute: 0.4 10*3/uL (ref 0.0–0.5)
Eosinophils Relative: 3 %
HCT: 46.7 % — ABNORMAL HIGH (ref 36.0–46.0)
Hemoglobin: 14.6 g/dL (ref 12.0–15.0)
Immature Granulocytes: 0 %
Lymphocytes Relative: 21 %
Lymphs Abs: 2.7 10*3/uL (ref 0.7–4.0)
MCH: 24.3 pg — ABNORMAL LOW (ref 26.0–34.0)
MCHC: 31.3 g/dL (ref 30.0–36.0)
MCV: 77.8 fL — ABNORMAL LOW (ref 80.0–100.0)
Monocytes Absolute: 1 10*3/uL (ref 0.1–1.0)
Monocytes Relative: 8 %
Neutro Abs: 8.4 10*3/uL — ABNORMAL HIGH (ref 1.7–7.7)
Neutrophils Relative %: 67 %
Platelet Count: 284 10*3/uL (ref 150–400)
RBC: 6 MIL/uL — ABNORMAL HIGH (ref 3.87–5.11)
RDW: 15.8 % — ABNORMAL HIGH (ref 11.5–15.5)
WBC Count: 12.5 10*3/uL — ABNORMAL HIGH (ref 4.0–10.5)
nRBC: 0 % (ref 0.0–0.2)

## 2023-07-08 LAB — IRON AND IRON BINDING CAPACITY (CC-WL,HP ONLY)
Iron: 22 ug/dL — ABNORMAL LOW (ref 28–170)
Saturation Ratios: 4 % — ABNORMAL LOW (ref 10.4–31.8)
TIBC: 504 ug/dL — ABNORMAL HIGH (ref 250–450)
UIBC: 482 ug/dL — ABNORMAL HIGH (ref 148–442)

## 2023-07-08 LAB — CMP (CANCER CENTER ONLY)
ALT: 15 U/L (ref 0–44)
AST: 16 U/L (ref 15–41)
Albumin: 4.2 g/dL (ref 3.5–5.0)
Alkaline Phosphatase: 82 U/L (ref 38–126)
Anion gap: 8 (ref 5–15)
BUN: 19 mg/dL (ref 8–23)
CO2: 29 mmol/L (ref 22–32)
Calcium: 10.1 mg/dL (ref 8.9–10.3)
Chloride: 100 mmol/L (ref 98–111)
Creatinine: 0.83 mg/dL (ref 0.44–1.00)
GFR, Estimated: >60 mL/min (ref >60)
Glucose, Bld: 140 mg/dL — ABNORMAL HIGH (ref 70–99)
Potassium: 4.3 mmol/L (ref 3.5–5.1)
Sodium: 137 mmol/L (ref 135–145)
Total Bilirubin: 0.3 mg/dL (ref <1.2)
Total Protein: 7.1 g/dL (ref 6.5–8.1)

## 2023-07-08 LAB — FERRITIN: Ferritin: 7 ng/mL — ABNORMAL LOW (ref 11–307)

## 2023-07-08 NOTE — Progress Notes (Signed)
Hematology and Oncology Follow Up Visit  Jenny Schneider [id number] [birth year] 67 y.o. 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 She is doing well but does note occasional episodes of fatigue.  Hgb is stable at 14.6, platelets 284 and WBC count 12.5.  Mild SOB with asthma exacerbation. No fever, chills, n/v, cough, rash, dizziness, chest pain, palpitations, abdominal pain or changes in bowel or bladder habits.  No swelling in her extremities.  Numbness and tingling in her right ring and pinky fingers unchanged from baseline.   No falls or syncope reported.  Appetite and hydration are good. Weight is 198 lbs.    ECOG Performance Status: 1 - Symptomatic but completely ambulatory  Medications:  Allergies as of 07/08/2023       Reactions   Beta Adrenergic Blockers Other (See Comments)   Wheezing        Medication List        Accurate as of July 08, 2023  1:01 PM. If you have any questions, ask your nurse or doctor.          acetaminophen 500 MG tablet Commonly known as: TYLENOL You can take 1000 mg of Tylenol every 8 hours.  Use this as your first treatment for pain.  Continue as long as you need pain medicine.  You can buy this over the counter at any drug store.   You can alternate this with ibuprofen and Tramadol if you need a third pain medicine.   albuterol 108 (90 Base) MCG/ACT inhaler Commonly known as: VENTOLIN HFA Inhale into the lungs every 6 (six) hours as needed for wheezing or shortness of breath.   aspirin EC 81 MG tablet Take 81 mg by mouth daily.   citalopram 10 MG tablet Commonly known as: CELEXA Take 10 mg by mouth daily.   dicyclomine 20 MG tablet Commonly known as: BENTYL Take 20 mg by mouth daily.   fexofenadine 180 MG tablet Commonly known as: ALLEGRA Take 180 mg by mouth daily.    glipiZIDE 5 MG tablet Commonly known as: GLUCOTROL Take 5 mg by mouth daily before breakfast.   hydrochlorothiazide 12.5 MG tablet Commonly known as: HYDRODIURIL Check your blood pressure on in AM before taking your blood pressure medicines, this is one.  Be sure BP is more than 100/60 before restarting this medicine.    Call your primary care with your blood pressure if you are not sure. Your normal dose is one daily.   ibuprofen 200 MG tablet Commonly known as: ADVIL Take 2-3 tablets (400-600 mg total) by mouth every 6 (six) hours as needed for moderate pain (Do not exceed 3 tablets every 6 hours, you can alternate this with Plain Tylenol).   lisinopril 20 MG tablet Commonly known as: ZESTRIL Take 20 mg by mouth 2 (two) times daily.   rosuvastatin 10 MG tablet Commonly known as: Crestor Take 1 tablet (10 mg total) by mouth daily.   Trelegy Ellipta 200-62.5-25 MCG/INH Aepb Generic drug: Fluticasone-Umeclidin-Vilant        Allergies:  Allergies  Allergen Reactions   Beta Adrenergic Blockers Other (See Comments)    Wheezing     Past Medical History, Surgical history, Social history, and Family History were reviewed and updated.  Review of Systems: All other 10 point review of systems is negative.  Physical Exam:  vitals were not taken for this visit.   Wt Readings from Last 3 Encounters:  05/19/23 204 lb 0.6 oz (92.6 kg)  02/28/23 205 lb 12.8 oz (93.4 kg)  12/10/22 204 lb 0.6 oz (92.6 kg)    Ocular: Sclerae unicteric, pupils equal, round and reactive to light Ear-nose-throat: Oropharynx clear, dentition fair Lymphatic: No cervical or supraclavicular adenopathy Lungs no rales or rhonchi, good excursion bilaterally Heart regular rate and rhythm, no murmur appreciated Abd soft, nontender, positive bowel sounds MSK no focal spinal tenderness, no joint edema Neuro: non-focal, well-oriented, appropriate affect Breasts: Deferred   Lab Results  Component Value  Date   WBC 12.5 (H) 07/08/2023   HGB 14.6 07/08/2023   HCT 46.7 (H) 07/08/2023   MCV 77.8 (L) 07/08/2023   PLT 284 07/08/2023   Lab Results  Component Value Date   FERRITIN 11 05/19/2023   IRON 25 (L) 05/19/2023   TIBC 511 (H) 05/19/2023   UIBC 486 (H) 05/19/2023   IRONPCTSAT 5 (L) 05/19/2023   Lab Results  Component Value Date   RETICCTPCT 1.6 10/06/2020   RBC 6.00 (H) 07/08/2023   No results found for: "KPAFRELGTCHN", "LAMBDASER", "KAPLAMBRATIO" No results found for: "IGGSERUM", "IGA", "IGMSERUM" No results found for: "TOTALPROTELP", "ALBUMINELP", "A1GS", "A2GS", "BETS", "BETA2SER", "GAMS", "MSPIKE", "SPEI"   Chemistry      Component Value Date/Time   NA 138 05/19/2023 1415   K 4.2 05/19/2023 1415   CL 102 05/19/2023 1415   CO2 28 05/19/2023 1415   BUN 18 05/19/2023 1415   CREATININE 0.99 05/19/2023 1415      Component Value Date/Time   CALCIUM 9.9 05/19/2023 1415   ALKPHOS 79 05/19/2023 1415   AST 19 05/19/2023 1415   ALT 19 05/19/2023 1415   BILITOT 0.2 (L) 05/19/2023 1415       Impression and Plan: Jenny Schneider is a very pleasant 67 yo caucasian female with COPD and history of intermittent erythrocytosis since at least 2019, JAK 2 negative.    No phlebotomy needed this visit.  Iron studies pending.  Follow-up in 3 months.   Eileen Stanford, NP 12/20/20241:01 PM

## 2023-10-07 ENCOUNTER — Inpatient Hospital Stay: Payer: BC Managed Care – PPO

## 2023-10-07 ENCOUNTER — Ambulatory Visit: Payer: BC Managed Care – PPO | Admitting: Family

## 2024-05-22 LAB — LAB REPORT - SCANNED
A1c: 6.8
Albumin, Urine POC: 1.4
Albumin/Creatinine Ratio, Urine, POC: 35
Creatinine, POC: 40 mg/dL
EGFR: 88

## 2024-05-23 ENCOUNTER — Telehealth (HOSPITAL_BASED_OUTPATIENT_CLINIC_OR_DEPARTMENT_OTHER): Payer: Self-pay

## 2024-05-23 ENCOUNTER — Other Ambulatory Visit (HOSPITAL_BASED_OUTPATIENT_CLINIC_OR_DEPARTMENT_OTHER): Payer: Self-pay | Admitting: Family Medicine

## 2024-05-23 ENCOUNTER — Encounter (HOSPITAL_BASED_OUTPATIENT_CLINIC_OR_DEPARTMENT_OTHER): Payer: Self-pay | Admitting: Family Medicine

## 2024-05-23 DIAGNOSIS — Z139 Encounter for screening, unspecified: Secondary | ICD-10-CM
